# Patient Record
Sex: Female | Born: 1980 | Race: White | Hispanic: No | Smoking: Former smoker
Health system: Southern US, Community
[De-identification: ages and names within clinical notes are randomized; demographics above are authoritative.]

## PROBLEM LIST (undated history)

## (undated) DIAGNOSIS — K589 Irritable bowel syndrome without diarrhea: Secondary | ICD-10-CM

## (undated) DIAGNOSIS — G43909 Migraine, unspecified, not intractable, without status migrainosus: Secondary | ICD-10-CM

## (undated) DIAGNOSIS — B009 Herpesviral infection, unspecified: Secondary | ICD-10-CM

## (undated) DIAGNOSIS — F419 Anxiety disorder, unspecified: Secondary | ICD-10-CM

## (undated) HISTORY — DX: Irritable bowel syndrome, unspecified: K58.9

## (undated) HISTORY — DX: Anxiety disorder, unspecified: F41.9

## (undated) HISTORY — PX: WISDOM TOOTH EXTRACTION: SHX21

---

## 2000-05-07 ENCOUNTER — Other Ambulatory Visit: Admission: RE | Admit: 2000-05-07 | Discharge: 2000-05-07 | Payer: Self-pay | Admitting: Obstetrics and Gynecology

## 2002-11-01 ENCOUNTER — Emergency Department (HOSPITAL_COMMUNITY): Admission: EM | Admit: 2002-11-01 | Discharge: 2002-11-01 | Payer: Self-pay | Admitting: Emergency Medicine

## 2002-11-02 ENCOUNTER — Emergency Department (HOSPITAL_COMMUNITY): Admission: EM | Admit: 2002-11-02 | Discharge: 2002-11-02 | Payer: Self-pay | Admitting: *Deleted

## 2004-03-03 ENCOUNTER — Emergency Department (HOSPITAL_COMMUNITY): Admission: EM | Admit: 2004-03-03 | Discharge: 2004-03-04 | Payer: Self-pay | Admitting: Emergency Medicine

## 2004-03-10 ENCOUNTER — Observation Stay (HOSPITAL_COMMUNITY): Admission: EM | Admit: 2004-03-10 | Discharge: 2004-03-11 | Payer: Self-pay | Admitting: Emergency Medicine

## 2004-03-14 ENCOUNTER — Inpatient Hospital Stay (HOSPITAL_COMMUNITY): Admission: RE | Admit: 2004-03-14 | Discharge: 2004-03-16 | Payer: Self-pay | Admitting: Obstetrics & Gynecology

## 2004-04-05 ENCOUNTER — Inpatient Hospital Stay (HOSPITAL_COMMUNITY): Admission: AD | Admit: 2004-04-05 | Discharge: 2004-04-08 | Payer: Self-pay | Admitting: Obstetrics & Gynecology

## 2004-09-16 ENCOUNTER — Emergency Department (HOSPITAL_COMMUNITY): Admission: EM | Admit: 2004-09-16 | Discharge: 2004-09-16 | Payer: Self-pay | Admitting: Emergency Medicine

## 2005-05-15 ENCOUNTER — Emergency Department (HOSPITAL_COMMUNITY): Admission: EM | Admit: 2005-05-15 | Discharge: 2005-05-15 | Payer: Self-pay | Admitting: Emergency Medicine

## 2006-02-10 ENCOUNTER — Emergency Department (HOSPITAL_COMMUNITY): Admission: EM | Admit: 2006-02-10 | Discharge: 2006-02-10 | Payer: Self-pay | Admitting: Emergency Medicine

## 2007-03-15 ENCOUNTER — Emergency Department (HOSPITAL_COMMUNITY): Admission: EM | Admit: 2007-03-15 | Discharge: 2007-03-15 | Payer: Self-pay | Admitting: Emergency Medicine

## 2010-06-10 NOTE — Consult Note (Signed)
[name], Rachel Carrillo NO.:  [id number]   MEDICAL RECORD NO.:  [id number]                   PATIENT TYPE:  EMS   LOCATION:  ED                                   FACILITY:  [facility]   PHYSICIAN:  Langley Gauss, M.D.                DATE OF BIRTH:  December 30, 1980   DATE OF CONSULTATION:  11/01/2002  DATE OF DISCHARGE:                                   CONSULTATION   REQUESTING PHYSICIAN:  Hilario Quarry, M.D.   CHIEF COMPLAINT:  The patient presented to the emergency room with the chief  complaint of pregnancy, x4 weeks yesterday with the onset of cramping and  vaginal bleeding about 1330 on the day of admission.  This is a 30 year old  gravida 3, para 1 with 1 prior vaginal delivery who was seen 1 day  previously on October 31, 2002 at [facility].  The patient reports  her last menstrual period as being September 06, 2002.  She states that she  subsequently did a home pregnancy test which was negative the first part of  September.  However, a home pregnancy test was subsequently positive on  October 1st.  The patient denies any breast tenderness, nausea or vomiting.  She had been completely asymptomatic when she was seen at [facility]  on October 31, 2002.  At that time she was only seen by Victorino Dike who  performed a transvaginal ultrasound which, patient report revealed an  intrauterine sac.  The patient was advised, at that time to follow up in 2  weeks time at which time a repeat transvaginal ultrasound would be  performed.  The patient was advised that it was too early in the pregnancy  to further delineate any fetal structures.  Subsequent quantitative beta hCG  had also been performed on October 31, 2002 which Dr. Rosalia Hammers reported to me  revealed a result of 14,000.  However, both the patient and her sister  refute this stating that the result was 1400.   The patient states that last p.m. she awake with some pelvic cramping with  radiation around  to her back described as moderate discomfort. Then about  1330 today she had the onset of some vaginal bleeding with the passage of  some clots into the commode.  At that point in time the patient presented to  [facility] at which time she was evaluated by Dr. Rosalia Hammers.   PAST MEDICAL HISTORY:  She has 1 prior vaginal delivery, now, a 11-year-old.  She has no other medical or surgical history.   CURRENT MEDICATIONS:  None.   ALLERGIES:  She has no known drug allergies.   SOCIAL HISTORY:  The patient is noted to be a cigarette smoker.  Her sister  is employed at [employer] and had obtained the  quantitative results from October 31, 2002 for 1400.   PAST MEDICAL HISTORY:  Pertinently in the patient's past medical history she  has no risk factors for ectopic pregnancy, specifically no prior history of  PID. No prior major surgical procedure. No known history of tubal occlusion  or history of infertility.  She denies any significant dysmenorrhea or any  menorrhagia with menses.   PHYSICAL EXAMINATION:  GENERAL:  The patient is noted to be in no acute  distress.  VITAL SIGNS:  Temperature 97.5, blood pressure 126/78, heart rate of 94,  respiratory rate is 20.  HEENT:  Negative.  No adenopathy.  Mucous membranes are moist.  NECK:  Supple.  Thyroid is nonpalpable.  LUNGS:  Clear.  CARDIOVASCULAR:  Exam is regular rate and rhythm.  ABDOMEN:  The abdomen is soft and nontender. No pelvic or abdominal masses  are appreciated.  No surgical scars are identified.  There is mild  tenderness in the right lower quadrant but no peritoneal signs.  Specifically no rebound or guarding identified.  EXTREMITIES:  Noted to be normal.  PELVIC:  Normal external genitalia.  No lesions or ulcerations identified.  No external evidence of any vaginal bleeding. The cervix is visualized.  The  os is noted to be closed close to a small Q-tip.  Several small clots within  the posterior  vaginal fornix which are evacuated.  There is no active  bleeding occurring at this time.  No abnormal discharge is noted.  GC and  Chlamydia cultures are performed from the endocervix.  Bimanual examination  reveals a minimal increased sized uterus. No adnexal masses are appreciated.  Minimal tenderness in the right side of the lower uterine segment.  No  suggestion of any free fluid within the pelvis.   LABORATORY STUDIES:  Reveal a white count of 8.5, hemoglobin 13.3,  hematocrit 39.2.  A quantitative beta hCG done today gives a result of 1700.  This represents an increase from 1400 to 1700 over a slightly greater than a  24-hour period.  Note, these specimens were not drawn concomitantly.  The  patient is noted to be O positive blood type with a negative antibody  screen.   A transabdominal ultrasound is performed by Dr. Roylene Reason. Lisette Grinder, of note,  today is Saturday and the hospital does not provide this service nor is  there availability of transvaginal ultrasound to be performed; hospital  administration is aware of this.  Thus, the best effort that I am able to  provide with this patient is a transabdominal ultrasound using the  ultrasound machine from labor and delivery.  A transabdominal ultrasound is  performed and interpreted by Dr. Roylene Reason. Lisette Grinder which reveals a very  small, poorly visualized intrauterine sac, minimal free fluid within the  pelvis.  Right and left ovaries poorly visualized, but noted to be normal in  appearance.  The patient tolerated the ultrasound very well.   ASSESSMENT:  Patient with an ultrasound 1 day previously revealing a 4-mm  intrauterine sac and quantitative beta hCG of 1400; impression by her  prenatal care providers was that of intrauterine pregnancy with plans to  repeat the sonogram in 2 weeks time.  The patient now presents with some  vaginal bleeding and passage of clots and an increased quantitative beta hCG to 1700.  Concern at this  time includes that of abnormal intrauterine  pregnancy, threatened abortion, but ectopic pregnancy continues to be a  consideration in the differential.  The patient is advised that we will  repeat the quantitative beta hCG on November 02, 2002 which will be a 48-hour  time interval.  She is made aware that should heavy vaginal bleeding occur,  or should the pelvic pain markedly increase in its intensity that she should  present to [facility] for reevaluation.      ___________________________________________                                            Langley Gauss, M.D.   DC/MEDQ  D:  11/01/2002  T:  11/02/2002  Job:  [id number]   cc:   [facility]   [facility]

## 2010-06-10 NOTE — H&P (Signed)
Rachel Carrillo, Rachel Carrillo            ACCOUNT NO.:  [id number]   MEDICAL RECORD NO.:  [id number]          PATIENT TYPE:  OIB   LOCATION:  LDR2                          FACILITY:  [facility]   PHYSICIAN:  Tilda Burrow, M.D. DATE OF BIRTH:  [birth year]   DATE OF ADMISSION:  03/13/2004  DATE OF DISCHARGE:  LH                                HISTORY & PHYSICAL   REASON FOR ADMISSION:  Pregnancy at 36 weeks and 3 days with hypokalemia,  nausea and vomiting ____irregular contractions_ and urinary tract infection.   HISTORY OF PRESENT ILLNESS:  Rachel Carrillo presented yesterday with nausea,  vomiting, dehydration, and _irregular_ contractions. She was admitted to  observation admission yesterday by Dr. Lisette Grinder and treated symptomatically  for her hypokalemia and for her nausea and vomiting _irregular___  contractions.   PHYSICAL EXAMINATION:  VITAL SIGNS:  Stable. Potassium was 3.1. Drug screen  was positive for THC. Lipase was 12. Urine nitrites were positive, many WBCs  on a catheter urine. Potassium was 3.2. SGOT was 115, SGPT was 96. Rubella  is immune. Hepatitis B is negative. HIV is negative. RPR is nonreactive.  Blood type is O positive. Ultrasound was obtained on February 17 for  abdominal pain and showed some thickening of the gallbladder and some sludge  in the gallbladder but no positive Murphy's sign at that time. Her first  prenatal visit was scheduled for today in the office, but due to her  hospitalization, that is not possible. Fundal height is 37-38 cm. Vital  signs were stable. The patient is able now to tolerate p.o.   PLAN:  We are going to repeat her Chem 7 to evaluate her hypokalemia and  monitor her I&O and continue her IV antibiotics for her UTI and probable  discharge in the a.m.      DL/MEDQ  D:  32/95/1884  T:  03/14/2004  Job:  [id number]

## 2010-06-10 NOTE — Op Note (Signed)
NAMEKELLYE, MIZNER            ACCOUNT NO.:  [id number]   MEDICAL RECORD NO.:  [id number]          PATIENT TYPE:  INP   LOCATION:  LDR1                          FACILITY:  [facility]   PHYSICIAN:  Tilda Burrow, M.D. DATE OF BIRTH:  28-Oct-1980   DATE OF PROCEDURE:  04/06/2004  DATE OF DISCHARGE:                                  PROCEDURE NOTE   DELIVERY SUMMARY:   ONSET OF LABOR:  April 06, 2004 at 8 a.m.   DATE OF DELIVERY:  April 06, 2004 at 1630 hours.   LENGTH OF FIRST STAGE LABOR:  8 hours, 15 minutes.   LENGTH OF SECOND STAGE LABOR:  15 minutes.   LENGTH OF THIRD STAGE LABOR:  5 minutes.   DELIVERY NOTE:  Leianne had a spontaneous vaginal delivery of a viable female  infant without any complications.  Upon delivery of infant, he was  thoroughly suctioned, dried, cord clamped and cut, and placed on the  mother's abdomen for nursery care.  The infant had good tone, pinked up  well, movement of all extremities, and good respirations_.  The third stage  of labor was actively managed with 20 units of Pitocin and 1000 cc of D-5 LR  at a rapid rate.  The perineum was noted to be intact upon inspection.  Cord  blood gas was obtained.  The placenta was delivered spontaneously via  Tomasa Blase mechanism.  Membranes were noted to be intact upon inspect.  Estimated blood loss was approximately 300 cc.  Infant and mother were  stabilized, and transferred out to the postpartum unit in stable condition.      DL/MEDQ  D:  16/10/9602  T:  04/06/2004  Job:  [id number]

## 2010-06-10 NOTE — Discharge Summary (Signed)
   NAMEJARETZY, Rachel Carrillo NO.:  [id number]   MEDICAL RECORD NO.:  [id number]                   PATIENT TYPE:   LOCATION:                                       FACILITY:   PHYSICIAN:  Langley Gauss, M.D.                DATE OF BIRTH:   DATE OF ADMISSION:  DATE OF DISCHARGE:                                 DISCHARGE SUMMARY   D&C with suction performed on November 02, 2002.  The patient also discharged  to home on November 02, 2002 following the procedure.   DISPOSITION:  The patient is to contact [facility] in the a.m. of  November 03, 2002 to arrange for appropriate follow up.   DISCHARGE MEDICATIONS:  She is treated with doxycycline 100 mg p.o. b.i.d.  x7 days. In addition Vicoprofen 1 p.o. q.4-6h. p.r.n. postoperative uterine  cramping.   She is give a copy of the standardized discharge instructions.  Pertinent  laboratory studies include admission hemoglobin and hematocrit 13.1/38.8  with a white count of 6.0.   HOSPITAL COURSE:  See previous dictations.  The patient was contacted and  decision was made to proceed with dilatation and curettage utilizing  suction.  After being processed through the emergency room, the patient was  taken to the OR where the operative procedure was performed without  complications.  No family members are available to discuss operative  findings following the procedure.  Her sister, Angelica Chessman, had dropped her off  but was engaged in child care responsibilities at that time.  However, she  was present prior to performing the procedure and was fully in agreement  with what procedure was planned.  Thus, the patient is discharged to home on  November 02, 2002.  As stated previously, she is advised to follow up with  [facility].     ___________________________________________                                         Langley Gauss, M.D.   DC/MEDQ  D:  11/04/2002  T:  11/04/2002  Job:  [id number]

## 2010-06-10 NOTE — Discharge Summary (Signed)
Rachel Carrillo, Rachel Carrillo            ACCOUNT NO.:  [id number]   MEDICAL RECORD NO.:  [id number]          PATIENT TYPE:  OBV   LOCATION:  A415                          FACILITY:  [facility]   PHYSICIAN:  [name], M.D.   DATE OF BIRTH:  Jun 28, 1980   DATE OF ADMISSION:  03/10/2004  DATE OF DISCHARGE:  02/17/2006LH                                 DISCHARGE SUMMARY   OBSERVATION NOTE   Claudina presented to the emergency room with complaints of nausea and  vomiting. She states that she had just received positive pregnancy  confirmation the week before from the emergency room doctor where she was  told she was 6 to [redacted] weeks pregnant based on her quantitative HCG level.  Obviously, this is not correct due to her fundal height of 35 cm and a  palpable approximate 6 pound infant. She was treated with IV fluids and  Phenergan. Her labs included elevated LFTs. Her gallbladder ultrasound was  positive for wall thickening and sludge. Otherwise, everything was  unremarkable. She stayed overnight and felt much better in the morning and  was able to tolerate a diet. She had an ultrasound done of the baby and her  gallbladder with the results as above. She was also given a hepatitis workup  which was negative due to the slight elevation of her LFTs and was  discharged home in stable condition.      FC/MEDQ  D:  03/15/2004  T:  03/15/2004  Job:  [id number]   cc:   [facility]

## 2010-06-10 NOTE — H&P (Signed)
Rachel Carrillo, Rachel Carrillo NO.:  [id number]   MEDICAL RECORD NO.:  [id number]                   PATIENT TYPE:  EMS   LOCATION:  ED                                   FACILITY:  [facility]   PHYSICIAN:  Langley Gauss, M.D.                DATE OF BIRTH:  1980/09/21   DATE OF ADMISSION:  11/02/2002  DATE OF DISCHARGE:                                HISTORY & PHYSICAL   HISTORY OF PRESENT ILLNESS:  This is a 30 year old, G3, P1 with a somewhat  unreliable last menstrual period who was seen in the emergency room the day  previously on November 01, 2002, at which time she was given the diagnosis of  threatened AB with vaginal bleeding and intrauterine sac on ultrasound per  [facility] on October 31, 2002.  The patient contacted me the a.m. of  November 02, 2002, stating that she continued to have vaginal bleeding, now  passing clots with cramping which was uncomfortable enough that kept her  awake most of the evening.  In addition, she has had serial quantitative  beta HCGs performed which have now begun to fall.  When I contacted the  patient by phone and discussed all these findings, we opted to proceed with  suction curettage in the operating room.  The patient was thus advised to  present to the emergency room on November 02, 2002.   PAST MEDICAL HISTORY:  One prior vaginal delivery.   ALLERGIES:  No known drug allergies.   CURRENT MEDICATIONS:  None.   PHYSICAL EXAMINATION:  GENERAL:  No acute distress.  VITAL SIGNS:  Blood pressure 105/63, pulse 80, respirations 20, temperature  98.1.  HEENT:  Negative.  No adenopathy.  NECK:  Supple.  Thyroid nonpalpable.  LUNGS:  Clear.  CARDIAC:  Regular rate and rhythm.  ABDOMEN:  Soft and nontender.  No surgical scars are identified.  EXTREMITIES:  Within normal limits.  PELVIC:  Normal external genitalia.  No lesions or ulcers identified.  There  is some blood on the inner thighs consistent with active  bleeding.  Sterile  speculum exam is performed which reveals about 50 cc of clots within the  vaginal vault which are evacuated.  Cervix is dilated enough to permit  passage of small Q-tip.  Bimanual examination reveals anteflexed uterus,  minimally enlarged with no adnexal masses.  Adnexa palpably normal.   LABORATORY DATA AND X-RAY FINDINGS:  The patient is noted to be O positive  blood type.  Serial quantitative beta HCGs performed.  Initially, the  patient was seen in [facility] on October 31, 2002, with a  quantitative beta HCG obtained of 147.  Upon presentation to the emergency  room on November 01, 2002, quantitative beta HCG had risen to 1700.  It is now  reassessed on November 02, 2002, 48 hours after the first and has now begun  to fall down  to 1640.   ASSESSMENT:  Spontaneous incomplete abortion with vaginal bleeding.  The  ultrasound had been performed in the office of [facility] on October 31, 2002, with the impression of the ultrasonographer being that of  intrauterine sac 4+ weeks' gestation too early  to further evaluate any  structures within the gestational sac.  The gestational sac size was  measured at 4 mm.   PLAN:  With the ongoing cramping and bleeding at this time, rather than  managing this expectantly, the patient prefers to be taken to the operating  room at which time suction dilatation and curettage can be performed.  Risks  and benefits of the procedure have been discussed extensively with the  patient and her sister, Angelica Chessman, in the emergency room on November 01, 2002.  The operating room is to be made readily available for the procedure.  The  patient was noted to have some intake by mouth at 11:30 a.m., thus  pericervical plus MAC will be the anesthesia of choice.     ___________________________________________                                         Langley Gauss, M.D.   DC/MEDQ  D:  11/04/2002  T:  11/04/2002  Job:  [id number]   cc:    [facility]

## 2010-06-10 NOTE — H&P (Signed)
NAMETRANISHA, TISSUE            ACCOUNT NO.:  [id number]   MEDICAL RECORD NO.:  [id number]          PATIENT TYPE:  OIB   LOCATION:  LDR2                          FACILITY:  [facility]   PHYSICIAN:  Langley Gauss, MD     DATE OF BIRTH:  Jun 03, 1980   DATE OF ADMISSION:  03/13/2004  DATE OF DISCHARGE:  LH                                HISTORY & PHYSICAL   The patient is a 30 year old patient with very minimal, inadequate, and late  prenatal care who comes in today with the chief complaint of nausea and  vomiting.  The patient's history began as follows.  February 29, 2004, the  patient began having the initial symptoms of nausea and vomiting with  resultant dehydration.  The patient presented to [facility]  on March 03, 2004, with these complaints.  The patient was a registered  patient of [facility], having been cared for previously.  On this  date of service, March 03, 2004, she was seen by the emergency room  physician only.  A surprise to the patient was the diagnosis of pregnancy.  She was advised by the emergency room staff that she was six to eight weeks  pregnant.  The patient was treated with IV fluids for less than 24 hours  duration; however, she states that this resulted in marked improvement in  her overall feeling. She states subsequently after being sent home from the  emergency room March 03, 2004, she very shortly thereafter had resumption  of the nausea and vomiting.  The patient is totally uncertain of whether or  not she has had any significant weight loss or weight gain during this  pregnancy, and currently she can be considered to be completely unreliable  as demonstrated by the emergency room visit March 03, 2004, at which time  she was completely oblivious of the fact that a pregnancy existed.  Pertinently in review of emergency room records at that time are  unavailable.   The patient subsequently stated that she returned to the  emergency room on  March 10, 2004, with similar complaints.  At that time, she was seen by  Dr. Margretta Ditty and was noted to have electrolyte abnormalities with sodium  129, potassium 3.2.  The patient subsequently had care undertaken by [facility].  She received IV fluids and subsequently radiologic studies  performed March 11, 2004.  The March 11, 2004, OB ultrasound revealed  intrauterine pregnancy at 35-5/[redacted] weeks gestation, female fetus.  Ultrasound of  the gallbladder resulted in the finding of sludge in the gallbladder,  slightly thickened gallbladder wall measuring 3.5 mm.  No discrete stones.  The patient had a negative Murphy's sign.  Mild right hydronephrosis likely  consistent with pregnancy was also seen.  The patient was discharged from  the hospital dated March 11, 2004, by [facility] with a followup  appointment scheduled for March 14, 2004.   The patient states she has continued to have episodic nausea and vomiting  since that point in time, now with vomiting of some greenish type substance.  Pertinently during  that most recent hospitalization and evaluation,  laboratory studies dated March 10, 2004, revealed abnormal liver function  tests with an SGOT of 115 (normal 0 to 37), SGPT 96 (normal 0 to 40), and  abnormal electrolytes identified.  The patient was discharged home with some  unknown medications.   OB HISTORY:  The patient does have one prior vaginal delivery.   PHYSICAL EXAMINATION:  GENERAL:  She appears in no acute distress at present  but was previously noted to be having heaving within the room.  VITAL SIGNS:  Blood pressure 108/66, pulse rate 80, respiratory rate 123.  ABDOMEN:  Soft and nontender.  Gravid uterus is identified.  No mid  epigastric or right upper quadrant tenderness elicited.  Fundal height is 34  cm.  Fetal heart tones are auscultated in the 150s.  EXTREMITIES:  Normal with no significant edema.   LABORATORY  DATA AND OTHER STUDIES:  Due to the indication of severe  hyperemesis gravidarum with hyponatremia, hypokalemia, and ketonuria, a non-  stress test is requested and interpreted.  Nonstress test fetal heart rate  baseline of 150.  There are accelerations noted of greater than 15 beats per  minute x greater than 15 seconds duration.  No fetal heart rate  decelerations are noted.  No uterine activity is demonstrated on the  external toco.   ASSESSMENT AND PLAN:  A patient with abnormal gallbladder ultrasound with  sludge identified.  In addition, she has elevated liver function tests per  the report of Eber Jones, R.N.  Hepatitis profile had been requested and  performed prior to discharge from the hospital on March 11, 2004.  The  patient will be continued on IV fluid hydration therapy, started on bland  low-fat diet, and treated empirically with IV antibiotics.  Subsequent care  will be transferred to [facility] on March 14, 2004.      DC/MEDQ  D:  03/13/2004  T:  03/13/2004  Job:  [id number]

## 2010-06-10 NOTE — H&P (Signed)
NAMEBERNADENE, GARSIDE            ACCOUNT NO.:  [id number]   MEDICAL RECORD NO.:  [id number]          PATIENT TYPE:  INP   LOCATION:  A416                          FACILITY:  [facility]   PHYSICIAN:  [name], M.D.   DATE OF BIRTH:  1980/05/16   DATE OF ADMISSION:  04/05/2004  DATE OF DISCHARGE:  03/17/2006LH                                HISTORY & PHYSICAL   HISTORY OF PRESENT ILLNESS:  Labor induction secondary to being term.  Lannie did not begin prenatal care until she was [redacted] weeks pregnant.  She  claims she did not know that she was pregnant.   Prenatal labs include blood type O+.  Rubella immune.  HB, SAG, HIV, RPR,  gonorrhea/Chlamydia, and group B strep were all negative.  A one-hour GGT  was normal at 103.  She was seropositive for HSV II.  She did not have time to begin her HSV prophylaxis.  We just found out about  it yesterday.  She did have a thorough vaginal exam, and no lesions were  noted.  No subjective symptoms by the patient to indicate active or oncoming  herpes reaction.   Blood pressure 110-120s/80s.  Fundal height 37 cm.  Estimated fetal weight  about 7 pounds.   PAST MEDICAL HISTORY:  Noncontributory.   PAST SURGICAL HISTORY:  Noncontributory.   No known drug allergies.   OB HISTORY:  Elective AB in 1998.  A vaginal delivery of a 10 pound female  in 1999.  Two first trimester miscarriages in 2005 and 2004.   IMPRESSION:  Intrauterine pregnancy at about 40 weeks.  Induction of labor.   PLAN:  Foley bulb induction and Pitocin and AROM in the morning.      FC/MEDQ  D:  04/08/2004  T:  04/08/2004  Job:  [id number]

## 2010-06-10 NOTE — Op Note (Signed)
NAMEGEANNA, DIVIRGILIO NO.:  [id number]   MEDICAL RECORD NO.:  [id number]                   PATIENT TYPE:  EMS   LOCATION:  ED                                   FACILITY:  [facility]   PHYSICIAN:  Langley Gauss, M.D.                DATE OF BIRTH:  [birth year]   DATE OF PROCEDURE:  DATE OF DISCHARGE:  11/02/2002                                 OPERATIVE REPORT   PREOPERATIVE DIAGNOSIS:  Spontaneous incomplete abortion, first trimester,  likely 5-[redacted] weeks gestation.   POSTOPERATIVE DIAGNOSIS:  Spontaneous incomplete abortion, first trimester,  likely 5-[redacted] weeks gestation.   PROCEDURE:  Dilatation and curettage utilizing # 9 suction catheter tip  performed by Dr. Roylene Reason. Lisette Grinder.   ESTIMATED BLOOD LOSS:  Minimal.   ANALGESIA:  The patient received MAC plus pericervical block placed by Dr.  Roylene Reason. Lisette Grinder.   SPECIMENS:  What appears to be a collapsed gestational sac is collected in  the suction canister and is sent to pathology for permanent section only.   OPERATIVE FINDINGS:  The patient was taken to the operating room and vital  signs were stable.  The patient was placed in the low lithotomy position  after receiving appropriate IV sedation. She is noted to have evidence of  active bleeding with blood on the inner thighs.   DESCRIPTION OF PROCEDURE:  The patient is then sterilely prepped and draped.  A sterile speculum examination is performed.  The cervix is noted to be  slightly opened.  The paracervical block is then placed by utilizing a total  of 8 cc of 1% lidocaine plain placing it at 2, 4, 8, and 10 o'clock  utilizing a spinal needle.  This allows the anterior lip of the cervix to be  grasped with a single-tooth tenaculum.  The uterus is gently sounded in  order to sound to a depth of 8 cm.  A very gentle, progressive dilatation is  then performed up to a size #23 dilator which allows passage of a #9 suction  catheter tip through the  cervix and up to the uterine fundus.  Suction is  then applied and with a slow circular withdrawing motion the entire uterine  cavity is suctioned curettaged of what appears to be products of conception.   A fine curettage is then performed of the intrauterine cavity.  The  intrauterine cavity is noted to be smooth and normal in contour.  No  additional products are obtained; thus the procedure is terminated.  Bimanual examination reveals a slightly enlarged uterus, but no adnexal  mass.  The specimen is sent for permanent section.  There is noted to be  minimal active bleeding.  The patient is, thus, taken to recovery room in  stable condition.      ___________________________________________  Langley Gauss, M.D.   DC/MEDQ  D:  11/04/2002  T:  11/04/2002  Job:  [id number]

## 2010-06-27 LAB — ANTIBODY SCREEN: Antibody Screen: NEGATIVE

## 2010-06-27 LAB — HEPATITIS B SURFACE ANTIGEN: Hepatitis B Surface Ag: NEGATIVE

## 2010-06-27 LAB — RUBELLA ANTIBODY, IGM: Rubella: IMMUNE

## 2010-06-27 LAB — HIV ANTIBODY (ROUTINE TESTING W REFLEX): HIV: NONREACTIVE

## 2010-07-25 ENCOUNTER — Other Ambulatory Visit (HOSPITAL_COMMUNITY)
Admission: RE | Admit: 2010-07-25 | Discharge: 2010-07-25 | Disposition: A | Discharge status: Home / Self Care | Payer: Medicaid Other | Source: Ambulatory Visit | Attending: Obstetrics & Gynecology | Admitting: Obstetrics & Gynecology

## 2010-07-25 DIAGNOSIS — Z01419 Encounter for gynecological examination (general) (routine) without abnormal findings: Secondary | ICD-10-CM | POA: Insufficient documentation

## 2010-07-25 DIAGNOSIS — Z113 Encounter for screening for infections with a predominantly sexual mode of transmission: Secondary | ICD-10-CM | POA: Insufficient documentation

## 2010-10-14 LAB — URINALYSIS, ROUTINE W REFLEX MICROSCOPIC
Nitrite: POSITIVE — AB
Protein, ur: 30 — AB
Specific Gravity, Urine: >1.03 — ABNORMAL HIGH

## 2010-10-14 LAB — URINE MICROSCOPIC-ADD ON

## 2010-10-14 LAB — URINE CULTURE: Colony Count: >=100000

## 2011-01-24 NOTE — L&D Delivery Note (Addendum)
Delivery Note At 11:09 PM a viable and healthy female was delivered via Vaginal, Spontaneous Delivery (Presentation: ; Occiput Anterior).  APGAR: 8, 8; weight 9 lb 13.3 oz (4459 g).   Placenta status: Intact, Spontaneous.  Cord: 3 vessels with the following complications: None.  Cord pH: not indicated   Anesthesia: Epidural  Episiotomy: None Lacerations: 1st degree Suture Repair: 3.0 vicryl Est. Blood Loss (mL): 350  Mom to postpartum.  Baby to nursery-stable  Dr Candelaria Celeste, DO, precepting.  Cameron Proud 02/17/2011, 11:37 PM

## 2011-02-14 ENCOUNTER — Encounter (HOSPITAL_COMMUNITY): Payer: Self-pay | Admitting: *Deleted

## 2011-02-14 ENCOUNTER — Observation Stay (HOSPITAL_COMMUNITY)
Admission: AD | Admit: 2011-02-14 | Discharge: 2011-02-15 | DRG: 780 | Disposition: A | Discharge status: Home / Self Care | Payer: Medicaid Other | Source: Ambulatory Visit | Attending: Obstetrics and Gynecology | Admitting: Obstetrics and Gynecology

## 2011-02-14 DIAGNOSIS — IMO0001 Reserved for inherently not codable concepts without codable children: Secondary | ICD-10-CM

## 2011-02-14 DIAGNOSIS — O479 False labor, unspecified: Principal | ICD-10-CM | POA: Diagnosis present

## 2011-02-14 DIAGNOSIS — O368390 Maternal care for abnormalities of the fetal heart rate or rhythm, unspecified trimester, not applicable or unspecified: Secondary | ICD-10-CM | POA: Diagnosis present

## 2011-02-14 LAB — URINE MICROSCOPIC-ADD ON

## 2011-02-14 LAB — URINALYSIS, ROUTINE W REFLEX MICROSCOPIC
Bilirubin Urine: NEGATIVE
Protein, ur: NEGATIVE mg/dL
Urobilinogen, UA: 0.2 mg/dL (ref 0.0–1.0)
pH: 6 (ref 5.0–8.0)

## 2011-02-14 MED ORDER — LACTATED RINGERS IV BOLUS (SEPSIS)
1000.0000 mL | Freq: Once | INTRAVENOUS | Status: AC
  Administered 2011-02-14: 1000 mL via INTRAVENOUS

## 2011-02-14 NOTE — Progress Notes (Signed)
Dr. Aviva Signs at bedside.  Strip reviewed and poc discussed with pt.

## 2011-02-14 NOTE — Progress Notes (Signed)
Pt returned from walking at this time.  efm and toco replaced.  Assessing.

## 2011-02-14 NOTE — Progress Notes (Signed)
D. Poe, CNM at bedside.  Assessment done and poc discussed with pt.  

## 2011-02-14 NOTE — Progress Notes (Signed)
Pt had membranes stripped today.

## 2011-02-14 NOTE — Progress Notes (Signed)
Notified of BP's.  Notified of VE with no change but more anterior and softer.  Orders to ambulate for an hour and then recheck.

## 2011-02-14 NOTE — Progress Notes (Signed)
Pt states, " They stripped my membranes at 10:00 am, and I was 4 cm. I had some bleeding afterwards. I see it if I wipe. I am now feeling constant pain in my low abdomen, and pressure in my vagina."

## 2011-02-14 NOTE — Progress Notes (Signed)
Pincus Badder, CNM reviewed strip.  Notified of tachycardia with indeterminate baseline and unsure of decels.  States baseline is 150 with prolonged accels.  Will dc home.

## 2011-02-14 NOTE — Progress Notes (Signed)
Pt up to bathroom for urine specimen collection.

## 2011-02-14 NOTE — Progress Notes (Signed)
Pt states she has been hurting since this morning before her appointment.  Constant pressure in lower abdomen and vagina.  Not sure if ctxs.

## 2011-02-14 NOTE — Progress Notes (Signed)
Notified of no cervical change but fetal tachycardia after walking.  Orders received for IV fluids.

## 2011-02-14 NOTE — ED Provider Notes (Signed)
Sande Rives [id number] @[redacted]w[redacted]d  Chief Complaint  Patient presents with  . Labor Eval    SUBJECTIVE  HPI: Crampy abd pain and bloody mucusy discharge since membranes stripped in office at [facility] this am. States cx was 4 cm today. Denies painful UCs but does not know what contractions feel like (though SVD x 2). No leakage of fluid or frank bleeding. Good fetal movement. No H/A, visual disturbance or epigastric pain. States BP 130/90 today.  Preg complicated by hx preE, hx LGA, UTI, smoker.  Past Medical History  Diagnosis Date  . No pertinent past medical history    Past Surgical History  Procedure Date  . Wisdom tooth extraction    History   Social History  . Marital Status: Married    Spouse Name: N/A    Number of Children: N/A  . Years of Education: N/A   Occupational History  . Not on file.   Social History Main Topics  . Smoking status: Current Everyday Smoker -- 0.2 packs/day  . Smokeless tobacco: Not on file  . Alcohol Use: No  . Drug Use: No  . Sexually Active:    Other Topics Concern  . Not on file   Social History Narrative  . No narrative on file   No current facility-administered medications on file prior to encounter.   No current outpatient prescriptions on file prior to encounter.   Not on File  ROS: Pertinent items in HPI  OBJECTIVE  BP 125/89  Pulse 105  Temp(Src) 98.5 F (36.9 C) (Oral)  Resp 20  Ht 5\' 4"  (1.626 m)  Wt 84.482 kg (186 lb 4 oz)  BMI 31.97 kg/m2  SpO2 98%  Physical Exam  Constitutional: She is oriented to person, place, and time and well-developed, well-nourished, and in no distress. No distress.  HENT:  Head: Normocephalic.  Neck: Neck supple.  Cardiovascular: Normal rate.   Pulmonary/Chest: Effort normal.  Abdominal: Soft. There is no tenderness.  Genitourinary:       VE by Jae Dire, RN: posterior, soft 3-4/70/-3 vtx, bloody show  Musculoskeletal: Normal range of motion.  Neurological: She is alert and  oriented to person, place, and time.  Skin: Skin is warm and dry.  Psychiatric: Affect normal.   Toco: Occasional UC and UI FHR 140 baseline, reactive, moderate variability  ASSESSMENT False labor Cat 1 FHR Borderline BP    PLAN Continue to monitor fetus and check serial BPs x 1 hr. Care turned over to Philipp Deputy, CNM at [phone].

## 2011-02-15 LAB — CBC
Hemoglobin: 10.8 g/dL — ABNORMAL LOW (ref 12.0–15.0)
MCH: 25.5 pg — ABNORMAL LOW (ref 26.0–34.0)
MCV: 80.4 fL (ref 78.0–100.0)
Platelets: 228 10*3/uL (ref 150–400)
RBC: 4.23 MIL/uL (ref 3.87–5.11)
WBC: 14 10*3/uL — ABNORMAL HIGH (ref 4.0–10.5)

## 2011-02-15 LAB — STREP B DNA PROBE: GBS: NEGATIVE

## 2011-02-15 LAB — GC/CHLAMYDIA PROBE AMP, GENITAL

## 2011-02-15 LAB — RPR: RPR Ser Ql: NONREACTIVE

## 2011-02-15 MED ORDER — FLEET ENEMA 7-19 GM/118ML RE ENEM: 1.0000 | ENEMA | RECTAL | Status: DC | PRN

## 2011-02-15 MED ORDER — LACTATED RINGERS IV SOLN: INTRAVENOUS | Status: DC

## 2011-02-15 MED ORDER — ONDANSETRON HCL 4 MG/2ML IJ SOLN: 4.0000 mg | Freq: Four times a day (QID) | INTRAMUSCULAR | Status: DC | PRN

## 2011-02-15 MED ORDER — LIDOCAINE HCL (PF) 1 % IJ SOLN: 30.0000 mL | INTRAMUSCULAR | Status: DC | PRN

## 2011-02-15 MED ORDER — CITRIC ACID-SODIUM CITRATE 334-500 MG/5ML PO SOLN: 30.0000 mL | ORAL | Status: DC | PRN

## 2011-02-15 MED ORDER — ACYCLOVIR 200 MG PO CAPS
400.0000 mg | ORAL_CAPSULE | Freq: Three times a day (TID) | ORAL | Status: DC
  Filled 2011-02-15: qty 2

## 2011-02-15 MED ORDER — NALBUPHINE SYRINGE 5 MG/0.5 ML
10.0000 mg | INJECTION | Freq: Once | INTRAMUSCULAR | Status: AC
  Administered 2011-02-15: 10 mg via INTRAMUSCULAR
  Filled 2011-02-15: qty 1

## 2011-02-15 MED ORDER — OXYCODONE-ACETAMINOPHEN 5-325 MG PO TABS: 2.0000 | ORAL_TABLET | ORAL | Status: DC | PRN

## 2011-02-15 MED ORDER — ZOLPIDEM TARTRATE 5 MG PO TABS: 5.0000 mg | ORAL_TABLET | Freq: Every evening | ORAL | Status: DC | PRN

## 2011-02-15 MED ORDER — OXYTOCIN 20 UNITS IN LACTATED RINGERS INFUSION - SIMPLE: 125.0000 mL/h | Freq: Once | INTRAVENOUS | Status: DC

## 2011-02-15 MED ORDER — ACYCLOVIR 400 MG PO TABS
400.0000 mg | ORAL_TABLET | Freq: Three times a day (TID) | ORAL | Status: DC
  Filled 2011-02-15: qty 1

## 2011-02-15 MED ORDER — ACETAMINOPHEN 325 MG PO TABS: 650.0000 mg | ORAL_TABLET | ORAL | Status: DC | PRN

## 2011-02-15 MED ORDER — OXYTOCIN BOLUS FROM INFUSION
500.0000 mL | Freq: Once | INTRAVENOUS | Status: DC
  Filled 2011-02-15: qty 500

## 2011-02-15 MED ORDER — HYDROXYZINE HCL 50 MG/ML IM SOLN
50.0000 mg | Freq: Once | INTRAMUSCULAR | Status: AC
  Administered 2011-02-15: 50 mg via INTRAMUSCULAR
  Filled 2011-02-15: qty 1

## 2011-02-15 MED ORDER — NALBUPHINE SYRINGE 5 MG/0.5 ML
10.0000 mg | INJECTION | Freq: Once | INTRAMUSCULAR | Status: AC
  Administered 2011-02-15: 10 mg via INTRAVENOUS
  Filled 2011-02-15: qty 1

## 2011-02-15 MED ORDER — LACTATED RINGERS IV SOLN: 500.0000 mL | INTRAVENOUS | Status: DC | PRN

## 2011-02-15 MED ORDER — IBUPROFEN 600 MG PO TABS: 600.0000 mg | ORAL_TABLET | Freq: Four times a day (QID) | ORAL | Status: DC | PRN

## 2011-02-15 NOTE — Progress Notes (Signed)
Pt may go to room 167. 

## 2011-02-15 NOTE — Progress Notes (Signed)
SVE per Dr. Aviva Signs.

## 2011-02-15 NOTE — Discharge Summary (Signed)
Obstetric Discharge Summary Reason for Admission: onset of labor and fetal tachycadia on FHMonitoring that resolved treating p'st pain and I/V fluids. Intrapartum Procedures: none Postpartum Procedures: none Complications-Operative and Postpartum: none Hemoglobin  Date Value Range Status  02/14/2011 10.8* 12.0-15.0 (g/dL) Final     HCT  Date Value Range Status  02/14/2011 34.0* 36.0-46.0 (%) Final    Discharge Diagnoses: false labor FHM  category 1 and no cervical changes for 4 hours. Discharge Information: Date: 02/15/2011 Activity: unrestricted Diet: routine Medications: None Condition: improved Instructions: refer to practice specific booklet Discharge to: home Follow-up Information    Follow up with [facility]. (as scheduled)          Newborn Data: No delivery.False labor.   D. Piloto Sherron Flemings Paz. MD PGY-1 02/15/2011, 6:19 AM

## 2011-02-15 NOTE — Discharge Summary (Signed)
Agree with above note.  Rachel Carrillo 02/15/2011 7:50 AM   

## 2011-02-15 NOTE — H&P (Signed)
Rachel Carrillo is a 31 y.o. female presenting for painful contractions. She was 4 cm at office at [facility] and membranes were stripped . No leakage of fluid or frank bleeding. Good fetal movement. BP 137/84 and then 121/82.  No headache, visual disturbance or epigastric pain. FHT Category 1 on admission on MAU. Pt was encouraged to walk and reevaluate cervical changes. Cervix was slowly changing but FH monitoring showed FHR baseline on high 150's to 160's after a bolus of 1L of LR. Pt very uncomfortable with regular painful contractions 2-3 min. We decided to admit for active phase of labor and FHM assessment.  Preg complicated by hx preE, hx LGA, UTI, smoker, HSV positive Pt with paper signed for BTL.  Maternal Medical History:  Reason for admission: Reason for Admission:   nausea  OB History    Grav Para Term Preterm Abortions TAB SAB Ect Mult Living   6 2 2  3  3   2      Past Medical History  Diagnosis Date  . No pertinent past medical history    Past Surgical History  Procedure Date  . Wisdom tooth extraction    Family History: family history is not on file. Social History:  reports that she has been smoking.  She does not have any smokeless tobacco history on file. She reports that she does not drink alcohol or use illicit drugs.  Review of Systems  Constitutional: Negative.  Negative for fever and chills.  HENT: Negative.   Eyes: Negative for blurred vision.  Respiratory: Negative.   Cardiovascular: Negative.   Gastrointestinal: Positive for abdominal pain. Negative for nausea and vomiting.  Genitourinary: Negative for dysuria and urgency.  Musculoskeletal: Positive for back pain.  Skin: Negative.   Neurological: Negative.  Negative for headaches.    Dilation: 5 Effacement (%): 80;70 Station: -2 Exam by:: Dr. Aviva Signs Blood pressure 121/82, pulse 92, temperature 98.5 F (36.9 C), temperature source Oral, resp. rate 20, height 5\' 4"  (1.626 m), weight 84.482 kg  (186 lb 4 oz), SpO2 98.00%. Exam Physical Exam  Constitutional: She is oriented to person, place, and time. She appears distressed.       Due to pain.  HENT:  Mouth/Throat: Oropharynx is clear and moist.  Eyes: Conjunctivae are normal.  Neck: Neck supple.  Cardiovascular: Normal rate and regular rhythm.   No murmur heard. Respiratory: Effort normal and breath sounds normal. No respiratory distress.  GI: Bowel sounds are normal.       Normal abdominal physical exam of a 39w gravid pt  Musculoskeletal: She exhibits no edema.  Neurological: She is alert and oriented to person, place, and time. She has normal reflexes.    Prenatal labs: ABO, Rh:  O positive Antibody: negative Rubella:  inmune RPR:   negative HBsAg:   negative HIV:   negative HSV: positive GBS:   negative 2h GTT: 79/ 126/131  Assessment: 1. Labor: active phase 2. Fetal Wellbeing: Category 2 3. Pain Control: Iv medication. May have epidural 4. GBS: neg 6. 39.5 week IUP  Plan:  1. Admit to BS 2. Routine L&D orders 3. Analgesia/anesthesia PRN   D. Piloto [employer]. MD PGY-1  02/15/2011, 12:38 AM

## 2011-02-15 NOTE — Progress Notes (Signed)
Rachel Carrillo is a 31 y.o. [id number] at [redacted]w[redacted]d by  admitted for spontaneous labor onset and FHT with tachycardia on monitor of MAU.   Subjective: More comfortable with contractions. No fluid leakage or bleeding. Since pain is managed with Iv medication fetal tachycardia resolved.  Objective: BP 128/86  Pulse 100  Temp(Src) 98.1 F (36.7 C) (Oral)  Resp 18  Ht 5\' 4"  (1.626 m)  Wt 84.482 kg (186 lb 4 oz)  BMI 31.97 kg/m2  SpO2 98%      FHT:  FHR: 140 bpm, variability: moderate,  accelerations:  Present,  decelerations:  Absent UC:   regular, every 2-3 minutes SVE:   Dilation: 3 Effacement (%): 80 Station: -2 Exam by:: LCarpenter,RN  Labs: Lab Results  Component Value Date   WBC 14.0* 02/14/2011   HGB 10.8* 02/14/2011   HCT 34.0* 02/14/2011   MCV 80.4 02/14/2011   PLT 228 02/14/2011    Assessment / Plan: Fetal tachycardia resolved after pain control on pt. FHT reassuring category 1. Labor: no cervical changes in 4 hours. Fetal Wellbeing:  Category I Pain Control:  I/V medication I/D:  n/a Possible discharge home.   D. Piloto Sherron Flemings Paz. MD PGY-1 02/15/2011, 5:44 AM

## 2011-02-15 NOTE — ED Provider Notes (Signed)
Agree with above note.  Indie Boehne 02/15/2011 7:54 AM

## 2011-02-15 NOTE — H&P (Signed)
Agree with above note.  Rachel Carrillo 02/15/2011 7:50 AM

## 2011-02-17 ENCOUNTER — Inpatient Hospital Stay (HOSPITAL_COMMUNITY)
Admission: AD | Admit: 2011-02-17 | Discharge: 2011-02-19 | DRG: 767 | Disposition: A | Discharge status: Home / Self Care | Payer: Medicaid Other | Source: Ambulatory Visit | Attending: Family Medicine | Admitting: Family Medicine

## 2011-02-17 ENCOUNTER — Inpatient Hospital Stay (HOSPITAL_COMMUNITY): Payer: Medicaid Other | Admitting: Anesthesiology

## 2011-02-17 ENCOUNTER — Encounter (HOSPITAL_COMMUNITY): Payer: Self-pay | Admitting: *Deleted

## 2011-02-17 ENCOUNTER — Encounter (HOSPITAL_COMMUNITY): Payer: Self-pay | Admitting: Anesthesiology

## 2011-02-17 DIAGNOSIS — Z9851 Tubal ligation status: Secondary | ICD-10-CM

## 2011-02-17 DIAGNOSIS — O0940 Supervision of pregnancy with grand multiparity, unspecified trimester: Secondary | ICD-10-CM

## 2011-02-17 DIAGNOSIS — Z302 Encounter for sterilization: Secondary | ICD-10-CM

## 2011-02-17 HISTORY — DX: Herpesviral infection, unspecified: B00.9

## 2011-02-17 LAB — CBC
HCT: 32.7 % — ABNORMAL LOW (ref 36.0–46.0)
Hemoglobin: 10.4 g/dL — ABNORMAL LOW (ref 12.0–15.0)
MCH: 25.2 pg — ABNORMAL LOW (ref 26.0–34.0)
MCV: 79.4 fL (ref 78.0–100.0)
RBC: 4.12 MIL/uL (ref 3.87–5.11)

## 2011-02-17 MED ORDER — OXYTOCIN 20 UNITS IN LACTATED RINGERS INFUSION - SIMPLE
1.0000 m[IU]/min | INTRAVENOUS | Status: DC
  Administered 2011-02-17: 2 m[IU]/min via INTRAVENOUS
  Filled 2011-02-17: qty 1000

## 2011-02-17 MED ORDER — LIDOCAINE HCL (PF) 1 % IJ SOLN
30.0000 mL | INTRAMUSCULAR | Status: DC | PRN
  Administered 2011-02-17: 30 mL via SUBCUTANEOUS
  Filled 2011-02-17: qty 30

## 2011-02-17 MED ORDER — FLEET ENEMA 7-19 GM/118ML RE ENEM: 1.0000 | ENEMA | RECTAL | Status: DC | PRN

## 2011-02-17 MED ORDER — PHENYLEPHRINE 40 MCG/ML (10ML) SYRINGE FOR IV PUSH (FOR BLOOD PRESSURE SUPPORT): 80.0000 ug | PREFILLED_SYRINGE | INTRAVENOUS | Status: DC | PRN

## 2011-02-17 MED ORDER — EPHEDRINE 5 MG/ML INJ: 10.0000 mg | INTRAVENOUS | Status: DC | PRN

## 2011-02-17 MED ORDER — LACTATED RINGERS IV SOLN
INTRAVENOUS | Status: DC
  Administered 2011-02-17 (×2): via INTRAVENOUS

## 2011-02-17 MED ORDER — ACETAMINOPHEN 325 MG PO TABS: 650.0000 mg | ORAL_TABLET | ORAL | Status: DC | PRN

## 2011-02-17 MED ORDER — LACTATED RINGERS IV SOLN
500.0000 mL | Freq: Once | INTRAVENOUS | Status: AC
  Administered 2011-02-17: 500 mL via INTRAVENOUS

## 2011-02-17 MED ORDER — OXYTOCIN BOLUS FROM INFUSION
500.0000 mL | Freq: Once | INTRAVENOUS | Status: DC
  Filled 2011-02-17: qty 500

## 2011-02-17 MED ORDER — NALBUPHINE SYRINGE 5 MG/0.5 ML
5.0000 mg | INJECTION | INTRAMUSCULAR | Status: DC | PRN
  Administered 2011-02-17: 10 mg via INTRAVENOUS
  Filled 2011-02-17: qty 1

## 2011-02-17 MED ORDER — OXYCODONE-ACETAMINOPHEN 5-325 MG PO TABS: 2.0000 | ORAL_TABLET | ORAL | Status: DC | PRN

## 2011-02-17 MED ORDER — DIPHENHYDRAMINE HCL 50 MG/ML IJ SOLN: 12.5000 mg | INTRAMUSCULAR | Status: DC | PRN

## 2011-02-17 MED ORDER — ONDANSETRON HCL 4 MG/2ML IJ SOLN: 4.0000 mg | Freq: Four times a day (QID) | INTRAMUSCULAR | Status: DC | PRN

## 2011-02-17 MED ORDER — IBUPROFEN 600 MG PO TABS: 600.0000 mg | ORAL_TABLET | Freq: Four times a day (QID) | ORAL | Status: DC | PRN

## 2011-02-17 MED ORDER — LIDOCAINE HCL 1.5 % IJ SOLN
INTRAMUSCULAR | Status: DC | PRN
  Administered 2011-02-17 (×3): 4 mL via INTRADERMAL

## 2011-02-17 MED ORDER — LACTATED RINGERS IV SOLN
500.0000 mL | INTRAVENOUS | Status: DC | PRN
  Administered 2011-02-17: 500 mL via INTRAVENOUS

## 2011-02-17 MED ORDER — TERBUTALINE SULFATE 1 MG/ML IJ SOLN: 0.2500 mg | Freq: Once | INTRAMUSCULAR | Status: AC | PRN

## 2011-02-17 MED ORDER — EPHEDRINE 5 MG/ML INJ
10.0000 mg | INTRAVENOUS | Status: DC | PRN
  Administered 2011-02-17: 10 mg via INTRAVENOUS
  Filled 2011-02-17: qty 4

## 2011-02-17 MED ORDER — CITRIC ACID-SODIUM CITRATE 334-500 MG/5ML PO SOLN: 30.0000 mL | ORAL | Status: DC | PRN

## 2011-02-17 MED ORDER — PHENYLEPHRINE 40 MCG/ML (10ML) SYRINGE FOR IV PUSH (FOR BLOOD PRESSURE SUPPORT)
80.0000 ug | PREFILLED_SYRINGE | INTRAVENOUS | Status: DC | PRN
  Filled 2011-02-17: qty 5

## 2011-02-17 MED ORDER — OXYTOCIN 20 UNITS IN LACTATED RINGERS INFUSION - SIMPLE: 125.0000 mL/h | Freq: Once | INTRAVENOUS | Status: DC

## 2011-02-17 MED ORDER — FENTANYL 2.5 MCG/ML BUPIVACAINE 1/10 % EPIDURAL INFUSION (WH - ANES)
14.0000 mL/h | INTRAMUSCULAR | Status: DC
  Administered 2011-02-17: 14 mL/h via EPIDURAL
  Filled 2011-02-17 (×2): qty 60

## 2011-02-17 NOTE — Progress Notes (Signed)
Subjective: Patient comfortable with epidural.  Objective: BP 120/85  Pulse 102  Temp(Src) 98.2 F (36.8 C) (Oral)  Resp 20  Ht 5\' 4"  (1.626 m)  Wt 84.369 kg (186 lb)  BMI 31.93 kg/m2  SpO2 99%     FHT:  FHR: 145 bpm, variability: moderate,  accelerations:  Present,  decelerations:  Absent UC:   regular, every 2-3 minutes SVE:   Dilation: 5 Effacement (%): 70 Station: -1 Exam by:: Jenelle Drennon, DO  Labs: Lab Results  Component Value Date   WBC 11.0* 02/17/2011   HGB 10.4* 02/17/2011   HCT 32.7* 02/17/2011   MCV 79.4 02/17/2011   PLT 167 02/17/2011    Assessment / Plan: Spontaneous labor, progressing normally  Labor: AROM with clear fluid.  IUPC placed Fetal Wellbeing:  Category I Pain Control:  Epidural I/D:  n/a Anticipated MOD:  NSVD  Rachel Carrillo 02/17/2011, 7:47 PM

## 2011-02-17 NOTE — Anesthesia Procedure Notes (Signed)
Epidural Patient location during procedure: OB Start time: 02/17/2011 5:28 PM Reason for block: procedure for pain  Staffing Performed by: anesthesiologist   Preanesthetic Checklist Completed: patient identified, site marked, surgical consent, pre-op evaluation, timeout performed, IV checked, risks and benefits discussed and monitors and equipment checked  Epidural Patient position: sitting Prep: site prepped and draped and DuraPrep Patient monitoring: continuous pulse ox and blood pressure Approach: midline Injection technique: LOR air  Needle:  Needle type: Tuohy  Needle gauge: 17 G Needle length: 9 cm Needle insertion depth: 5 cm cm Catheter type: closed end flexible Catheter size: 19 Gauge Catheter at skin depth: 10 cm Test dose: negative  Assessment Events: blood not aspirated, injection not painful, no injection resistance, negative IV test and no paresthesia  Additional Notes Discussed risk of headache, infection, bleeding, nerve injury and failed or incomplete block.  Patient voices understanding and wishes to proceed.

## 2011-02-17 NOTE — Progress Notes (Signed)
Contractions since 0700 

## 2011-02-17 NOTE — H&P (Signed)
Rachel Carrillo is a 31 y.o. female presenting for SOL. Maternal Medical History:  Reason for admission: Reason for admission: contractions.  Contractions: Onset was yesterday.   Frequency: regular.   Perceived severity is moderate.    Fetal activity: Perceived fetal activity is normal.   Last perceived fetal movement was within the past hour.    Prenatal Complications - Diabetes: none.    OB History    Grav Para Term Preterm Abortions TAB SAB Ect Mult Living   6 2 2  3  3   2      Past Medical History  Diagnosis Date  . No pertinent past medical history    Past Surgical History  Procedure Date  . Wisdom tooth extraction    Family History: family history is not on file. Social History:  reports that she has been smoking.  She has never used smokeless tobacco. She reports that she does not drink alcohol or use illicit drugs.  Review of Systems  All other systems reviewed and are negative.    Dilation: 3 Effacement (%): 70 Station: -3 Exam by:: S. Carrera, RNC Blood pressure 110/76, pulse 82, temperature 98.2 F (36.8 C), resp. rate 18, height 5\' 4"  (1.626 m), weight 84.369 kg (186 lb). Maternal Exam:  Uterine Assessment: Contraction strength is moderate.  Contraction duration is 30 seconds. Contraction frequency is regular.   Abdomen: Fundal height is term.   Estimated fetal weight is 7.5#.   Fetal presentation: vertex  Introitus: Ferning test: not done.  Nitrazine test: not done.  Pelvis: adequate for delivery.   Cervix: Cervix evaluated by digital exam.     Fetal Exam Fetal Monitor Review: Mode: hand-held doppler probe.   Baseline rate: 140s.  Variability: moderate (6-25 bpm).   Pattern: no decelerations and no accelerations.    Fetal State Assessment: Category I - tracings are normal.     Physical Exam  Constitutional: She is oriented to person, place, and time. She appears well-developed and well-nourished.  Respiratory: Effort normal.  GI:  Soft. Bowel sounds are normal. She exhibits no distension and no mass. There is no tenderness. There is no rebound and no guarding.  Musculoskeletal: Normal range of motion.  Neurological: She is alert and oriented to person, place, and time.  Skin: Skin is warm and dry.  Psychiatric: She has a normal mood and affect. Her behavior is normal. Judgment and thought content normal.    Prenatal labs: ABO, Rh: O/Positive/-- (06/04 0000) Antibody: Negative (06/04 0000) Rubella: Immune (06/04 0000) RPR: NON REACTIVE (01/22 2213)  HBsAg: Negative (06/04 0000)  HIV: Non-reactive (06/04 0000)  GBS: Negative (01/23 0122)   Assessment/Plan: 1.  IUP at 40 weeks 2.  Active Labor.  Will admit and provide expectant management.  Breast feed.  Desires PP BTL.     STINSON, JACOB JEHIEL 02/17/2011, 1:36 PM

## 2011-02-17 NOTE — Anesthesia Preprocedure Evaluation (Signed)
Anesthesia Evaluation  Patient identified by MRN, date of birth, ID band Patient awake    Reviewed: Allergy & Precautions, H&P , NPO status , Patient's Chart, lab work & pertinent test results, reviewed documented beta blocker date and time   History of Anesthesia Complications Negative for: history of anesthetic complications  Airway Mallampati: II TM Distance: >3 FB Neck ROM: full    Dental  (+) Teeth Intact   Pulmonary Current Smoker,  clear to auscultation        Cardiovascular neg cardio ROS regular Normal    Neuro/Psych Negative Neurological ROS  Negative Psych ROS   GI/Hepatic Neg liver ROS, GERD-  Medicated and Controlled,  Endo/Other  Negative Endocrine ROS  Renal/GU negative Renal ROS  Genitourinary negative   Musculoskeletal   Abdominal   Peds  Hematology negative hematology ROS (+)   Anesthesia Other Findings   Reproductive/Obstetrics (+) Pregnancy                           Anesthesia Physical Anesthesia Plan  ASA: II  Anesthesia Plan: Epidural   Post-op Pain Management:    Induction:   Airway Management Planned:   Additional Equipment:   Intra-op Plan:   Post-operative Plan:   Informed Consent: I have reviewed the patients History and Physical, chart, labs and discussed the procedure including the risks, benefits and alternatives for the proposed anesthesia with the patient or authorized representative who has indicated his/her understanding and acceptance.     Plan Discussed with:   Anesthesia Plan Comments:         Anesthesia Quick Evaluation

## 2011-02-18 ENCOUNTER — Inpatient Hospital Stay (HOSPITAL_COMMUNITY): Payer: Medicaid Other | Admitting: Anesthesiology

## 2011-02-18 ENCOUNTER — Encounter (HOSPITAL_COMMUNITY): Payer: Self-pay | Admitting: Anesthesiology

## 2011-02-18 ENCOUNTER — Encounter (HOSPITAL_COMMUNITY)
Admission: AD | Disposition: A | Discharge status: Home / Self Care | Payer: Self-pay | Source: Ambulatory Visit | Attending: Family Medicine

## 2011-02-18 DIAGNOSIS — Z302 Encounter for sterilization: Secondary | ICD-10-CM

## 2011-02-18 DIAGNOSIS — Z9851 Tubal ligation status: Secondary | ICD-10-CM

## 2011-02-18 HISTORY — PX: TUBAL LIGATION: SHX77

## 2011-02-18 LAB — SURGICAL PCR SCREEN
MRSA, PCR: NEGATIVE
Staphylococcus aureus: POSITIVE — AB

## 2011-02-18 LAB — CBC
MCH: 25.1 pg — ABNORMAL LOW (ref 26.0–34.0)
MCHC: 31.6 g/dL (ref 30.0–36.0)
MCV: 79.4 fL (ref 78.0–100.0)
Platelets: 191 10*3/uL (ref 150–400)

## 2011-02-18 SURGERY — LIGATION, FALLOPIAN TUBE, POSTPARTUM
Anesthesia: Epidural | Site: Abdomen | Laterality: Bilateral | Wound class: Clean

## 2011-02-18 MED ORDER — FENTANYL CITRATE 0.05 MG/ML IJ SOLN: 25.0000 ug | INTRAMUSCULAR | Status: DC | PRN

## 2011-02-18 MED ORDER — DIBUCAINE 1 % RE OINT: 1.0000 "application " | TOPICAL_OINTMENT | RECTAL | Status: DC | PRN

## 2011-02-18 MED ORDER — KETOROLAC TROMETHAMINE 30 MG/ML IJ SOLN
INTRAMUSCULAR | Status: DC | PRN
  Administered 2011-02-18: 30 mg via INTRAVENOUS
  Administered 2011-02-18: 30 mg via INTRAMUSCULAR

## 2011-02-18 MED ORDER — METHYLERGONOVINE MALEATE 0.2 MG/ML IJ SOLN: 0.2000 mg | INTRAMUSCULAR | Status: DC | PRN

## 2011-02-18 MED ORDER — MIDAZOLAM HCL 5 MG/5ML IJ SOLN
INTRAMUSCULAR | Status: DC | PRN
  Administered 2011-02-18 (×2): 1 mg via INTRAVENOUS

## 2011-02-18 MED ORDER — METOCLOPRAMIDE HCL 5 MG/ML IJ SOLN: 10.0000 mg | Freq: Once | INTRAMUSCULAR | Status: DC | PRN

## 2011-02-18 MED ORDER — METHYLERGONOVINE MALEATE 0.2 MG/ML IJ SOLN
INTRAMUSCULAR | Status: AC
  Administered 2011-02-18: 0.2 mg via INTRAMUSCULAR
  Filled 2011-02-18: qty 1

## 2011-02-18 MED ORDER — TETANUS-DIPHTH-ACELL PERTUSSIS 5-2.5-18.5 LF-MCG/0.5 IM SUSP: 0.5000 mL | Freq: Once | INTRAMUSCULAR | Status: DC

## 2011-02-18 MED ORDER — PROPOFOL 10 MG/ML IV EMUL
INTRAVENOUS | Status: AC
  Filled 2011-02-18: qty 20

## 2011-02-18 MED ORDER — IBUPROFEN 600 MG PO TABS
600.0000 mg | ORAL_TABLET | Freq: Four times a day (QID) | ORAL | Status: DC
  Administered 2011-02-18 – 2011-02-19 (×5): 600 mg via ORAL
  Filled 2011-02-18 (×5): qty 1

## 2011-02-18 MED ORDER — METHYLERGONOVINE MALEATE 0.2 MG PO TABS: 0.2000 mg | ORAL_TABLET | ORAL | Status: DC | PRN

## 2011-02-18 MED ORDER — FENTANYL CITRATE 0.05 MG/ML IJ SOLN
INTRAMUSCULAR | Status: AC
  Filled 2011-02-18: qty 2

## 2011-02-18 MED ORDER — ONDANSETRON HCL 4 MG/2ML IJ SOLN
INTRAMUSCULAR | Status: DC | PRN
  Administered 2011-02-18: 4 mg via INTRAVENOUS

## 2011-02-18 MED ORDER — LACTATED RINGERS IV SOLN
INTRAVENOUS | Status: DC | PRN
  Administered 2011-02-18: 10:00:00 via INTRAVENOUS

## 2011-02-18 MED ORDER — FENTANYL CITRATE 0.05 MG/ML IJ SOLN
100.0000 ug | Freq: Once | INTRAMUSCULAR | Status: AC
  Administered 2011-02-18: 100 ug via INTRAVENOUS

## 2011-02-18 MED ORDER — PROPOFOL 10 MG/ML IV EMUL
INTRAVENOUS | Status: DC | PRN
  Administered 2011-02-18 (×2): 20 mg via INTRAVENOUS

## 2011-02-18 MED ORDER — MISOPROSTOL 200 MCG PO TABS
1000.0000 ug | ORAL_TABLET | Freq: Once | ORAL | Status: AC
  Administered 2011-02-18: 1000 ug via RECTAL

## 2011-02-18 MED ORDER — ONDANSETRON HCL 4 MG/2ML IJ SOLN: 4.0000 mg | INTRAMUSCULAR | Status: DC | PRN

## 2011-02-18 MED ORDER — BUPIVACAINE HCL (PF) 0.5 % IJ SOLN
INTRAMUSCULAR | Status: DC | PRN
  Administered 2011-02-18: 20 mL

## 2011-02-18 MED ORDER — METOCLOPRAMIDE HCL 10 MG PO TABS
10.0000 mg | ORAL_TABLET | Freq: Once | ORAL | Status: AC
  Administered 2011-02-18: 10 mg via ORAL
  Filled 2011-02-18: qty 1

## 2011-02-18 MED ORDER — LACTATED RINGERS IV SOLN
Freq: Once | INTRAVENOUS | Status: AC
  Administered 2011-02-18: 20 mL/h via INTRAVENOUS

## 2011-02-18 MED ORDER — FAMOTIDINE 20 MG PO TABS
40.0000 mg | ORAL_TABLET | Freq: Once | ORAL | Status: AC
  Administered 2011-02-18: 40 mg via ORAL
  Filled 2011-02-18: qty 2

## 2011-02-18 MED ORDER — MEPERIDINE HCL 25 MG/ML IJ SOLN: 6.2500 mg | INTRAMUSCULAR | Status: DC | PRN

## 2011-02-18 MED ORDER — LIDOCAINE HCL (CARDIAC) 20 MG/ML IV SOLN
INTRAVENOUS | Status: DC | PRN
  Administered 2011-02-18: 40 mg via INTRAVENOUS

## 2011-02-18 MED ORDER — KETOROLAC TROMETHAMINE 30 MG/ML IJ SOLN
INTRAMUSCULAR | Status: AC
  Filled 2011-02-18: qty 1

## 2011-02-18 MED ORDER — SIMETHICONE 80 MG PO CHEW: 80.0000 mg | CHEWABLE_TABLET | ORAL | Status: DC | PRN

## 2011-02-18 MED ORDER — DIPHENHYDRAMINE HCL 25 MG PO CAPS: 25.0000 mg | ORAL_CAPSULE | Freq: Four times a day (QID) | ORAL | Status: DC | PRN

## 2011-02-18 MED ORDER — ONDANSETRON HCL 4 MG PO TABS: 4.0000 mg | ORAL_TABLET | ORAL | Status: DC | PRN

## 2011-02-18 MED ORDER — ZOLPIDEM TARTRATE 5 MG PO TABS: 5.0000 mg | ORAL_TABLET | Freq: Every evening | ORAL | Status: DC | PRN

## 2011-02-18 MED ORDER — SENNOSIDES-DOCUSATE SODIUM 8.6-50 MG PO TABS
2.0000 | ORAL_TABLET | Freq: Every day | ORAL | Status: DC
  Administered 2011-02-18: 2 via ORAL

## 2011-02-18 MED ORDER — SODIUM BICARBONATE 8.4 % IV SOLN
INTRAVENOUS | Status: AC
  Filled 2011-02-18: qty 50

## 2011-02-18 MED ORDER — LIDOCAINE-EPINEPHRINE (PF) 2 %-1:200000 IJ SOLN
INTRAMUSCULAR | Status: AC
  Filled 2011-02-18: qty 20

## 2011-02-18 MED ORDER — FENTANYL CITRATE 0.05 MG/ML IJ SOLN
INTRAMUSCULAR | Status: DC | PRN
  Administered 2011-02-18 (×2): 50 ug via INTRAVENOUS

## 2011-02-18 MED ORDER — PRENATAL MULTIVITAMIN CH
1.0000 | ORAL_TABLET | Freq: Every day | ORAL | Status: DC
  Administered 2011-02-19: 1 via ORAL
  Filled 2011-02-18: qty 1

## 2011-02-18 MED ORDER — WITCH HAZEL-GLYCERIN EX PADS: 1.0000 "application " | MEDICATED_PAD | CUTANEOUS | Status: DC | PRN

## 2011-02-18 MED ORDER — LIDOCAINE HCL (CARDIAC) 20 MG/ML IV SOLN
INTRAVENOUS | Status: AC
  Filled 2011-02-18: qty 5

## 2011-02-18 MED ORDER — MIDAZOLAM HCL 2 MG/2ML IJ SOLN
INTRAMUSCULAR | Status: AC
  Filled 2011-02-18: qty 2

## 2011-02-18 MED ORDER — KETOROLAC TROMETHAMINE 30 MG/ML IJ SOLN
INTRAMUSCULAR | Status: AC
Start: 2011-02-18 — End: 2011-02-18
  Filled 2011-02-18: qty 1

## 2011-02-18 MED ORDER — ONDANSETRON HCL 4 MG/2ML IJ SOLN
INTRAMUSCULAR | Status: AC
  Filled 2011-02-18: qty 2

## 2011-02-18 MED ORDER — BENZOCAINE-MENTHOL 20-0.5 % EX AERO: 1.0000 "application " | INHALATION_SPRAY | CUTANEOUS | Status: DC | PRN

## 2011-02-18 MED ORDER — LANOLIN HYDROUS EX OINT: TOPICAL_OINTMENT | CUTANEOUS | Status: DC | PRN

## 2011-02-18 MED ORDER — OXYCODONE-ACETAMINOPHEN 5-325 MG PO TABS
1.0000 | ORAL_TABLET | ORAL | Status: DC | PRN
  Administered 2011-02-18 (×2): 1 via ORAL
  Filled 2011-02-18 (×2): qty 1

## 2011-02-18 MED ORDER — BUPIVACAINE HCL (PF) 0.5 % IJ SOLN
INTRAMUSCULAR | Status: AC
  Filled 2011-02-18: qty 30

## 2011-02-18 MED ORDER — SODIUM CHLORIDE 0.9 % IR SOLN
Status: DC | PRN
  Administered 2011-02-18: 1000 mL

## 2011-02-18 MED ORDER — SODIUM BICARBONATE 8.4 % IV SOLN
INTRAVENOUS | Status: DC | PRN
  Administered 2011-02-18: 5 mL via EPIDURAL

## 2011-02-18 MED ORDER — MISOPROSTOL 200 MCG PO TABS
ORAL_TABLET | ORAL | Status: AC
  Filled 2011-02-18: qty 5

## 2011-02-18 SURGICAL SUPPLY — 27 items
APL SKNCLS STERI-STRIP NONHPOA (GAUZE/BANDAGES/DRESSINGS)
BENZOIN TINCTURE PRP APPL 2/3 (GAUZE/BANDAGES/DRESSINGS) IMPLANT
CHLORAPREP W/TINT 26ML (MISCELLANEOUS) ×2 IMPLANT
CLIP FILSHIE TUBAL LIGA STRL (Clip) ×2 IMPLANT
CLOTH BEACON ORANGE TIMEOUT ST (SAFETY) ×2 IMPLANT
DRSG COVADERM PLUS 2X2 (GAUZE/BANDAGES/DRESSINGS) ×1 IMPLANT
ELECT REM PT RETURN 9FT ADLT (ELECTROSURGICAL) ×2
ELECTRODE REM PT RTRN 9FT ADLT (ELECTROSURGICAL) IMPLANT
GLOVE BIO SURGEON STRL SZ7 (GLOVE) ×2 IMPLANT
GLOVE BIOGEL PI IND STRL 7.0 (GLOVE) ×2 IMPLANT
GLOVE BIOGEL PI INDICATOR 7.0 (GLOVE) ×2
GOWN PREVENTION PLUS LG XLONG (DISPOSABLE) ×4 IMPLANT
NDL HYPO 25X1 1.5 SAFETY (NEEDLE) ×1 IMPLANT
NEEDLE HYPO 25X1 1.5 SAFETY (NEEDLE) ×2 IMPLANT
NS IRRIG 1000ML POUR BTL (IV SOLUTION) ×2 IMPLANT
PACK ABDOMINAL MINOR (CUSTOM PROCEDURE TRAY) ×2 IMPLANT
PENCIL BUTTON HOLSTER BLD 10FT (ELECTRODE) ×2 IMPLANT
SPONGE LAP 4X18 X RAY DECT (DISPOSABLE) ×1 IMPLANT
STRIP CLOSURE SKIN 1/2X4 (GAUZE/BANDAGES/DRESSINGS) IMPLANT
SUT CHROMIC 2 0 TIES 18 (SUTURE) IMPLANT
SUT VIC AB 0 CT1 27 (SUTURE) ×2
SUT VIC AB 0 CT1 27XBRD ANBCTR (SUTURE) ×1 IMPLANT
SUT VICRYL 4-0 PS2 18IN ABS (SUTURE) ×2 IMPLANT
SYR CONTROL 10ML LL (SYRINGE) ×2 IMPLANT
TOWEL OR 17X24 6PK STRL BLUE (TOWEL DISPOSABLE) ×4 IMPLANT
TRAY FOLEY CATH 14FR (SET/KITS/TRAYS/PACK) ×2 IMPLANT
WATER STERILE IRR 1000ML POUR (IV SOLUTION) ×1 IMPLANT

## 2011-02-18 NOTE — Anesthesia Postprocedure Evaluation (Signed)
  Anesthesia Post-op Note  Patient: Rachel Carrillo  Procedure(s) Performed:  POST PARTUM TUBAL LIGATION - Post partum tubal ligation bilateral with filshie clips.  Patient Location: PACU and Mother/Baby  Anesthesia Type: Epidural  Level of Consciousness: awake, alert  and oriented  Airway and Oxygen Therapy: Patient Spontanous Breathing  Post-op Assessment: Patient's Cardiovascular Status Stable and Respiratory Function Stable  Post-op Vital Signs: stable  Complications: No apparent anesthesia complications 

## 2011-02-18 NOTE — Anesthesia Postprocedure Evaluation (Signed)
  Anesthesia Post-op Note  Patient: Rachel Carrillo  Procedure(s) Performed:  POST PARTUM TUBAL LIGATION - Post partum tubal ligation bilateral with filshie clips.  Patient Location: PACU  Anesthesia Type: Epidural  Level of Consciousness: awake, alert  and oriented  Airway and Oxygen Therapy: Patient Spontanous Breathing  Post-op Pain: none  Post-op Assessment: Post-op Vital signs reviewed, Patient's Cardiovascular Status Stable, Respiratory Function Stable, Patent Airway, No signs of Nausea or vomiting, Pain level controlled, No headache and No backache  Post-op Vital Signs: Reviewed and stable  Complications: No apparent anesthesia complications

## 2011-02-18 NOTE — Addendum Note (Signed)
Addendum  created 02/18/11 1754 by Len Blalock, CRNA   Modules edited:Notes Section

## 2011-02-18 NOTE — Transfer of Care (Signed)
Immediate Anesthesia Transfer of Care Note  Patient: Rachel Carrillo  Procedure(s) Performed:  POST PARTUM TUBAL LIGATION - Post partum tubal ligation bilateral with filshie clips.  Patient Location: PACU  Anesthesia Type: Epidural  Level of Consciousness: awake, alert , oriented and patient cooperative  Airway & Oxygen Therapy: Patient Spontanous Breathing  Post-op Assessment: Post -op Vital signs reviewed and stable, Report to RN when available  Post vital signs: Reviewed  Complications: No apparent anesthesia complications

## 2011-02-18 NOTE — Anesthesia Postprocedure Evaluation (Signed)
  Anesthesia Post-op Note  Patient: Rachel Carrillo  Procedure(s) Performed: * Lumbar Epidural for L&D *  Patient Location: PACU and Mother/Baby  Anesthesia Type: Epidural  Level of Consciousness: awake, alert  and oriented  Airway and Oxygen Therapy: Patient Spontanous Breathing  Post-op Pain: none  Post-op Assessment: Post-op Vital signs reviewed, Patient's Cardiovascular Status Stable, Respiratory Function Stable, Patent Airway, No signs of Nausea or vomiting, Pain level controlled, No headache, No backache, No residual numbness and No residual motor weakness  Post-op Vital Signs: Reviewed and stable  Complications: No apparent anesthesia complications

## 2011-02-18 NOTE — Progress Notes (Signed)
Patient c/o back soreness and abdominal cramping. RN offered ibuprofen but patient questions whether she should take it with the amount of bleeding she has had. Dr. Garen Lah called. Okay for ibuprofen. Instructed this RN to place order for CBC this morning and NPO. Will continue to monitor patient.

## 2011-02-18 NOTE — Progress Notes (Signed)
Post Partum Day 1 (less than 24 hrs) Subjective: no complaints and up ad lib . Patient has no complaints., denies any lightheadedness, CP, SHOB Bleeding has decreased significantly.  Patient NPO, to have BTL today  Objective: Blood pressure 141/92, pulse 87, temperature 99.5 F (37.5 C), temperature source Oral, resp. rate 20, height 5\' 4"  (1.626 m), weight 186 lb (84.369 kg), SpO2 99.00%, unknown if currently breastfeeding.  Physical Exam:  General: alert, cooperative, appears stated age and no distress Lochia: appropriate Uterine Fundus: firm Incision: N/A DVT Evaluation: No evidence of DVT seen on physical exam. Negative Homan's sign. No cords or calf tenderness. No significant calf/ankle edema.   Basename 02/18/11 0548 02/17/11 1416  HGB 10.1* 10.4*  HCT 32.0* 32.7*    Assessment/Plan: Breastfeeding, Lactation consult and Contraception BTL Patient to have BTL today Post partum hemorrhage: controlled, continue to monitor, consider IVF and recheck H&H if patient symptomatic. Repeat H&H stable this am, though expected since blood loss was acute Routine care today, lactation consult, pain control Anticipate DC home tomorrow if continued improvement.    LOS: 1 day   Cameron Proud 02/18/2011, 6:28 AM

## 2011-02-18 NOTE — Anesthesia Preprocedure Evaluation (Signed)
Anesthesia Evaluation  Patient identified by MRN, date of birth, ID band Patient awake    Reviewed: Allergy & Precautions, H&P , NPO status , Patient's Chart, lab work & pertinent test results, reviewed documented beta blocker date and time   History of Anesthesia Complications Negative for: history of anesthetic complications  Airway Mallampati: II TM Distance: >3 FB Neck ROM: full    Dental No notable dental hx. (+) Teeth Intact   Pulmonary Current Smoker,  clear to auscultation  Pulmonary exam normal       Cardiovascular neg cardio ROS regular Normal    Neuro/Psych Negative Neurological ROS  Negative Psych ROS   GI/Hepatic Neg liver ROS, GERD-  Medicated and Controlled,  Endo/Other  Negative Endocrine ROS  Renal/GU negative Renal ROS  Genitourinary negative   Musculoskeletal   Abdominal Normal abdominal exam  (+)   Peds  Hematology negative hematology ROS (+)   Anesthesia Other Findings   Reproductive/Obstetrics negative OB ROS                           Anesthesia Physical  Anesthesia Plan  ASA: II  Anesthesia Plan: Epidural   Post-op Pain Management:    Induction:   Airway Management Planned:   Additional Equipment:   Intra-op Plan:   Post-operative Plan:   Informed Consent: I have reviewed the patients History and Physical, chart, labs and discussed the procedure including the risks, benefits and alternatives for the proposed anesthesia with the patient or authorized representative who has indicated his/her understanding and acceptance.     Plan Discussed with: Anesthesiologist, CRNA and Surgeon  Anesthesia Plan Comments:         Anesthesia Quick Evaluation

## 2011-02-18 NOTE — Addendum Note (Signed)
Addendum  created 02/18/11 1756 by Len Blalock, CRNA   Modules edited:Notes Section

## 2011-02-18 NOTE — Op Note (Signed)
Rachel Carrillo  PREOPERATIVE DIAGNOSIS:  Multiparity, undesired fertility  POSTOPERATIVE DIAGNOSIS:  Multiparity, undesired fertility  PROCEDURE:  Postpartum Bilateral Tubal Sterilization using Filshie Clips   ANESTHESIA:  Epidural and local analgesia using 0.5% Marcaine  COMPLICATIONS:  None immediate.  ESTIMATED BLOOD LOSS: 5 ml.  FLUIDS: 600 ml LR.  URINE OUTPUT:  100 ml of clear urine.  INDICATIONS: 31 y.o. [id number]  with undesired fertility,status post vaginal delivery, desires permanent sterilization.  Other reversible forms of contraception were discussed with patient; she declines all other modalities. Risks of procedure discussed with patient including but not limited to: risk of regret, permanence of method, bleeding, infection, injury to surrounding organs and need for additional procedures.  Failure risk of 0.5-1% with increased risk of ectopic gestation if pregnancy occurs was also discussed with patient.     FINDINGS:  Normal uterus, tubes, and ovaries. Small left paratubal cyst.  PROCEDURE DETAILS: The patient was taken to the operating room where her epidural anesthesia was dosed up to surgical level and found to be adequate.  She was then placed in the dorsal supine position and prepped and draped in sterile fashion.  After an adequate timeout was performed, attention was turned to the patient's abdomen where a small transverse skin incision was made under the umbilical fold. The incision was taken down to the layer of fascia using the scalpel, and fascia was incised, and extended bilaterally using Mayo scissors. The peritoneum was entered in a sharp fashion. Attention was then turned to the patient's uterus, and left fallopian tube was identified and followed out to the fimbriated end.  A Filshie clip was placed on the left fallopian tube about 2 cm from the cornual attachment, with care given to incorporate the underlying mesosalpinx.  A similar process was carried out on  the right side allowing for bilateral tubal sterilization.  There was a small paratubal cyst was excised without complication; it was benign in appearance and was not sent to pathology.   Good hemostasis was noted overall.  Local analgesia was injected into both Filshie application sites.The instruments were then removed from the patient's abdomen and the fascial incision was repaired with 0 Vicryl, and the skin was closed with a 4-0 Vicryl subcuticular stitch. The patient tolerated the procedure well.  Instrument, sponge, and needle counts were correct times two.  The patient was then taken to the recovery room awake and in stable condition.  Hildreth Orsak A 02/18/2011 10:33 AM

## 2011-02-18 NOTE — Preoperative (Signed)
Beta Blockers   Reason not to administer Beta Blockers:Not Applicable 

## 2011-02-18 NOTE — Anesthesia Postprocedure Evaluation (Signed)
  Anesthesia Post-op Note  Patient: Rachel Carrillo  Procedure(s) Performed:  POST PARTUM TUBAL LIGATION - Post partum tubal ligation bilateral with filshie clips.  Patient Location: PACU and Mother/Baby  Anesthesia Type: Epidural  Level of Consciousness: awake, alert  and oriented  Airway and Oxygen Therapy: Patient Spontanous Breathing  Post-op Assessment: Patient's Cardiovascular Status Stable and Respiratory Function Stable  Post-op Vital Signs: stable  Complications: No apparent anesthesia complications

## 2011-02-19 NOTE — Discharge Summary (Signed)
Agree with above note.  Rachel Carrillo H. 02/19/2011 7:29 PM

## 2011-02-19 NOTE — Progress Notes (Signed)
Post Partum Day 2; post op day 1 Subjective: no complaints, up ad lib, voiding, tolerating PO and + flatus  breast feeding going well. Patient denies SHOB, CP, leg pain.   Objective: Blood pressure 126/89, pulse 62, temperature 97.6 F (36.4 C), temperature source Oral, resp. rate 20, height 5\' 4"  (1.626 m), weight 84.369 kg (186 lb), SpO2 97.00%, unknown if currently breastfeeding.  Physical Exam:  General: alert, cooperative, appears stated age and no distress Lochia: appropriate Uterine Fundus: firm Incision: healing well, no significant drainage, no dehiscence, no significant erythema DVT Evaluation: No evidence of DVT seen on physical exam. Negative Homan's sign. No cords or calf tenderness. No significant calf/ankle edema.   Basename 02/18/11 0548 02/17/11 1416  HGB 10.1* 10.4*  HCT 32.0* 32.7*    Assessment/Plan: Discharge home and Contraception BTL Pain control: ibuprofen followup in 6wks   LOS: 2 days   Cameron Proud 02/19/2011, 7:15 AM

## 2011-02-19 NOTE — Discharge Summary (Signed)
Obstetric Discharge Summary Reason for Admission: onset of labor Prenatal Procedures: none Intrapartum Procedures: spontaneous vaginal delivery Postpartum Procedures: P.P. tubal ligation Complications-Operative and Postpartum: hemorrhage Hemoglobin  Date Value Range Status  02/18/2011 10.1* 12.0-15.0 (g/dL) Final     HCT  Date Value Range Status  02/18/2011 32.0* 36.0-46.0 (%) Final    Discharge Diagnoses: Term Pregnancy-delivered  Discharge Information: Date: 02/19/2011 Activity: unrestricted Diet: routine Medications: PNV, Percocet and acyclovir, omeprazole, ambien Condition: stable Instructions: refer to practice specific booklet Discharge to: home Follow-up Information    Follow up with [facility] in 6 weeks.   Contact information:   [address] [place] [address]          Newborn Data: Live born female  Birth Weight: 9 lb 13.3 oz (4459 g) APGAR: 8, 8  Home with mother.  Rachel Carrillo 02/19/2011, 7:53 AM

## 2011-02-20 ENCOUNTER — Encounter (HOSPITAL_COMMUNITY): Payer: Self-pay | Admitting: Obstetrics & Gynecology

## 2011-02-20 NOTE — Progress Notes (Signed)
Post discharge chart review completed.  

## 2012-03-15 ENCOUNTER — Emergency Department (HOSPITAL_COMMUNITY)
Admission: EM | Admit: 2012-03-15 | Discharge: 2012-03-15 | Disposition: A | Discharge status: Home / Self Care | Payer: Medicaid Other | Attending: Emergency Medicine | Admitting: Emergency Medicine

## 2012-03-15 ENCOUNTER — Encounter (HOSPITAL_COMMUNITY): Payer: Self-pay

## 2012-03-15 ENCOUNTER — Emergency Department (HOSPITAL_COMMUNITY): Payer: Medicaid Other

## 2012-03-15 DIAGNOSIS — Z87891 Personal history of nicotine dependence: Secondary | ICD-10-CM | POA: Insufficient documentation

## 2012-03-15 DIAGNOSIS — Z79899 Other long term (current) drug therapy: Secondary | ICD-10-CM | POA: Insufficient documentation

## 2012-03-15 DIAGNOSIS — N61 Mastitis without abscess: Secondary | ICD-10-CM

## 2012-03-15 DIAGNOSIS — Z8619 Personal history of other infectious and parasitic diseases: Secondary | ICD-10-CM | POA: Insufficient documentation

## 2012-03-15 MED ORDER — AZITHROMYCIN 250 MG PO TABS: ORAL_TABLET | ORAL | Status: DC

## 2012-03-15 NOTE — ED Notes (Signed)
Pt reports is breast feeding and was diagnosed with mastitis by Urgent care doctor.  Reports the doctor told her she had an abscess that would require surgery.

## 2012-03-18 NOTE — ED Provider Notes (Signed)
History     CSN: [id number]  Arrival date & time 03/15/12  1040   First MD Initiated Contact with Patient 03/15/12 1104      Chief Complaint  Patient presents with  . Breast Pain    (Consider location/radiation/quality/duration/timing/severity/associated sxs/prior treatment) HPI Comments: Patient c/o pain to her right breast for several days.  States that she was seen at a local urgent care just prior to ED arrival stating that she was instructed to come here because she had an abscess to her breast that would need to be drained surgically.  Patient states she has been breast feeding for 13 months and continues to produce milk from the affected breast.  She denies fever, vomiting or chills.  She also denies redness to the breast or asymmetry  The history is provided by the patient.    Past Medical History  Diagnosis Date  . HSV-2 infection     Past Surgical History  Procedure Laterality Date  . Wisdom tooth extraction    . Tubal ligation  02/18/2011    Procedure: POST PARTUM TUBAL LIGATION;  Surgeon: [name], MD;  Location: [facility];  Service: Gynecology;  Laterality: Bilateral;  Post partum tubal ligation bilateral with filshie clips.    No family history on file.  History  Substance Use Topics  . Smoking status: Former Smoker -- 0.25 packs/day  . Smokeless tobacco: Never Used  . Alcohol Use: No    OB History   Grav Para Term Preterm Abortions TAB SAB Ect Mult Living   6 3 3  3  3   3       Review of Systems  Constitutional: Negative for fever, chills, activity change and appetite change.  HENT: Negative for facial swelling, neck pain and neck stiffness.   Respiratory: Negative for cough, chest tightness and shortness of breath.   Cardiovascular: Negative for chest pain.       Right breast tenderness  Gastrointestinal: Negative for nausea, vomiting and abdominal pain.  Genitourinary: Negative for dysuria and flank pain.  Skin: Negative for color change,  rash and wound.  Neurological: Negative for dizziness and headaches.  Hematological: Negative for adenopathy.  All other systems reviewed and are negative.    Allergies  Review of patient's allergies indicates no known allergies.  Home Medications   Current Outpatient Rx  Name  Route  Sig  Dispense  Refill  . flintstones complete (FLINTSTONES) 60 MG chewable tablet   Oral   Chew 2 tablets by mouth daily.         Marland Kitchen azithromycin (ZITHROMAX Z-PAK) 250 MG tablet      Take two tablets on day one, then one tab qd days 2-5   6 each   0     BP 127/93  Pulse 82  Temp(Src) 98.3 F (36.8 C) (Oral)  Resp 20  Ht 5\' 4"  (1.626 m)  Wt 170 lb (77.111 kg)  BMI 29.17 kg/m2  SpO2 97%  LMP 03/08/2012  Breastfeeding? Yes  Physical Exam  Nursing note and vitals reviewed. Constitutional: She is oriented to person, place, and time.  Cardiovascular: Normal rate, regular rhythm, normal heart sounds and intact distal pulses.   No murmur heard. Pulmonary/Chest: Effort normal and breath sounds normal. No respiratory distress. She has no wheezes. She exhibits tenderness. She exhibits no bony tenderness, no crepitus, no edema, no deformity, no swelling and no retraction. Right breast exhibits tenderness. Right breast exhibits no inverted nipple, no mass, no nipple discharge and  no skin change. Left breast exhibits no inverted nipple, no mass, no nipple discharge and no skin change. Breasts are symmetrical.    Musculoskeletal: Normal range of motion.  Neurological: She is oriented to person, place, and time. She exhibits normal muscle tone. Coordination normal.  Skin: Skin is warm and dry.    ED Course  Procedures (including critical care time)  Labs Reviewed - No data to display No results found.   US Breast Right  03/15/2012  *RADIOLOGY REPORT*  Clinical Data: Breast pain in a nursing patient.  CHEST ULTRASOUND  Comparison:  None.  Findings: Limited ultrasound imaging of the right  breast was performed.  No focal fluid collection or mass is identified.  IMPRESSION: Negative for abscess or acute abnormality.  If clinical concern persists, the patient should return next Wednesday, 03/20/2012 for evaluation by a breast imager.   Original Report Authenticated By: Holley Dexter, M.D.     1. Mastitis       MDM   Mild ttp of the upper right breast.  No erythema, excessive warmth.  No palpable masses or nipple discharge.  Pt does have fibrous changes of the breast bilaterally.  I do not clinically suspect abscess, likely mastitis. Will order US of the breast.  Discussed pt hx , imaging result and care plan with the EDP.  Pt agrees to warm soaks, will prescribe azithromycin And pt agrees to close f/u with Dr. Emelda Fear.    The patient appears reasonably screened and/or stabilized for discharge and I doubt any other medical condition or other [facility] requiring further screening, evaluation, or treatment in the ED at this time prior to discharge.     Rachel Schoenfeldt L. Crystalee Ventress, PA 03/18/12 1800

## 2012-03-19 NOTE — ED Provider Notes (Signed)
Medical screening examination/treatment/procedure(s) were performed by non-physician practitioner and as supervising physician I was immediately available for consultation/collaboration.   Shelda Jakes, MD 03/19/12 [phone]

## 2012-05-24 ENCOUNTER — Encounter: Payer: Self-pay | Admitting: *Deleted

## 2012-07-02 ENCOUNTER — Other Ambulatory Visit: Payer: Self-pay | Admitting: Obstetrics & Gynecology

## 2012-07-04 ENCOUNTER — Other Ambulatory Visit: Payer: Self-pay | Admitting: Obstetrics & Gynecology

## 2012-07-10 ENCOUNTER — Encounter: Payer: Self-pay | Admitting: *Deleted

## 2012-07-11 ENCOUNTER — Ambulatory Visit (INDEPENDENT_AMBULATORY_CARE_PROVIDER_SITE_OTHER): Payer: Medicaid Other | Admitting: Obstetrics & Gynecology

## 2012-07-11 ENCOUNTER — Encounter: Payer: Self-pay | Admitting: Obstetrics & Gynecology

## 2012-07-11 VITALS — BP 110/80 | Ht 64.0 in | Wt 174.0 lb

## 2012-07-11 DIAGNOSIS — Z0000 Encounter for general adult medical examination without abnormal findings: Secondary | ICD-10-CM

## 2012-07-11 DIAGNOSIS — Z01419 Encounter for gynecological examination (general) (routine) without abnormal findings: Secondary | ICD-10-CM

## 2012-07-11 MED ORDER — NORGESTIM-ETH ESTRAD TRIPHASIC 0.18/0.215/0.25 MG-25 MCG PO TABS: 1.0000 | ORAL_TABLET | Freq: Every day | ORAL | Status: DC

## 2012-07-11 NOTE — Progress Notes (Signed)
Patient ID: Rachel Carrillo, female   DOB: 1980/02/14, 32 y.o.   MRN: [id number] Subjective:     Rachel Carrillo is a 32 y.o. female here for a routine exam.  Patient's last menstrual period was 06/11/2012. [id number] Current complaints: menorrhagia.  Personal health questionnaire reviewed: no.   Gynecologic History Patient's last menstrual period was 06/11/2012. Contraception: tubal ligation Last Pap: 2013. Results were: normal Last mammogram: na. Results were: na  Obstetric History OB History   Grav Para Term Preterm Abortions TAB SAB Ect Mult Living   6 3 3  3  3   3      # Outc Date GA Lbr Len/2nd Wgt Sex Del Anes PTL Lv   1 TRM 1/13 102w0d 15:10 / 00:29 9lb13.3oz(4.459kg) F SVD EPI  Yes   Comments: wnl   2 TRM            3 TRM            4 SAB            5 SAB            6 SAB                The following portions of the patient's history were reviewed and updated as appropriate: allergies, current medications, past family history, past medical history, past social history, past surgical history and problem list.  Review of Systems  Review of Systems  Constitutional: Negative for fever, chills, weight loss, malaise/fatigue and diaphoresis.  HENT: Negative for hearing loss, ear pain, nosebleeds, congestion, sore throat, neck pain, tinnitus and ear discharge.   Eyes: Negative for blurred vision, double vision, photophobia, pain, discharge and redness.  Respiratory: Negative for cough, hemoptysis, sputum production, shortness of breath, wheezing and stridor.   Cardiovascular: Negative for chest pain, palpitations, orthopnea, claudication, leg swelling and PND.  Gastrointestinal: negative for abdominal pain. Negative for heartburn, nausea, vomiting, diarrhea, constipation, blood in stool and melena.  Genitourinary: Negative for dysuria, urgency, frequency, hematuria and flank pain.  Musculoskeletal: Negative for myalgias, back pain, joint pain and falls.  Skin: Negative for  itching and rash.  Neurological: Negative for dizziness, tingling, tremors, sensory change, speech change, focal weakness, seizures, loss of consciousness, weakness and headaches.  Endo/Heme/Allergies: Negative for environmental allergies and polydipsia. Does not bruise/bleed easily.  Psychiatric/Behavioral: Negative for depression, suicidal ideas, hallucinations, memory loss and substance abuse. The patient is not nervous/anxious and does not have insomnia.        Objective:    Physical Exam  Vitals reviewed. Constitutional: She is oriented to person, place, and time. She appears well-developed and well-nourished.  HENT:  Head: Normocephalic and atraumatic.        Right Ear: External ear normal.  Left Ear: External ear normal.  Nose: Nose normal.  Mouth/Throat: Oropharynx is clear and moist.  Eyes: Conjunctivae and EOM are normal. Pupils are equal, round, and reactive to light. Right eye exhibits no discharge. Left eye exhibits no discharge. No scleral icterus.  Neck: Normal range of motion. Neck supple. No tracheal deviation present. No thyromegaly present.  Cardiovascular: Normal rate, regular rhythm, normal heart sounds and intact distal pulses.  Exam reveals no gallop and no friction rub.   No murmur heard. Respiratory: Effort normal and breath sounds normal. No respiratory distress. She has no wheezes. She has no rales. She exhibits no tenderness.  GI: Soft. Bowel sounds are normal. She exhibits no distension and no mass. There is no tenderness.  There is no rebound and no guarding.  Genitourinary:       Vulva is normal without lesions Vagina is pink moist without discharge Cervix normal in appearance and pap is done Uterus is normal size shape and contour Adnexa is negative with normal sized ovaries   Musculoskeletal: Normal range of motion. She exhibits no edema and no tenderness.  Neurological: She is alert and oriented to person, place, and time. She has normal reflexes. She  displays normal reflexes. No cranial nerve deficit. She exhibits normal muscle tone. Coordination normal.  Skin: Skin is warm and dry. No rash noted. No erythema. No pallor.  Psychiatric: She has a normal mood and affect. Her behavior is normal. Judgment and thought content normal.       Assessment:    Healthy female exam.    Plan:    Follow up in: 1 year. OCP for cycle control

## 2012-07-11 NOTE — Patient Instructions (Signed)

## 2012-09-11 ENCOUNTER — Encounter (HOSPITAL_COMMUNITY): Payer: Self-pay

## 2012-09-11 ENCOUNTER — Emergency Department (HOSPITAL_COMMUNITY)
Admission: EM | Admit: 2012-09-11 | Discharge: 2012-09-11 | Disposition: A | Discharge status: Home / Self Care | Payer: Medicaid Other | Attending: Emergency Medicine | Admitting: Emergency Medicine

## 2012-09-11 DIAGNOSIS — N390 Urinary tract infection, site not specified: Secondary | ICD-10-CM | POA: Insufficient documentation

## 2012-09-11 DIAGNOSIS — Z87891 Personal history of nicotine dependence: Secondary | ICD-10-CM | POA: Insufficient documentation

## 2012-09-11 DIAGNOSIS — Z79899 Other long term (current) drug therapy: Secondary | ICD-10-CM | POA: Insufficient documentation

## 2012-09-11 DIAGNOSIS — Z8619 Personal history of other infectious and parasitic diseases: Secondary | ICD-10-CM | POA: Insufficient documentation

## 2012-09-11 DIAGNOSIS — R350 Frequency of micturition: Secondary | ICD-10-CM | POA: Insufficient documentation

## 2012-09-11 DIAGNOSIS — R3989 Other symptoms and signs involving the genitourinary system: Secondary | ICD-10-CM | POA: Insufficient documentation

## 2012-09-11 DIAGNOSIS — R300 Dysuria: Secondary | ICD-10-CM | POA: Insufficient documentation

## 2012-09-11 LAB — URINALYSIS, ROUTINE W REFLEX MICROSCOPIC
Nitrite: NEGATIVE
Specific Gravity, Urine: <1.005 — ABNORMAL LOW (ref 1.005–1.030)
Urobilinogen, UA: 0.2 mg/dL (ref 0.0–1.0)
pH: 6 (ref 5.0–8.0)

## 2012-09-11 LAB — URINE MICROSCOPIC-ADD ON

## 2012-09-11 MED ORDER — PHENAZOPYRIDINE HCL 200 MG PO TABS: 200.0000 mg | ORAL_TABLET | Freq: Three times a day (TID) | ORAL | Status: DC

## 2012-09-11 MED ORDER — SULFAMETHOXAZOLE-TRIMETHOPRIM 800-160 MG PO TABS: 1.0000 | ORAL_TABLET | Freq: Two times a day (BID) | ORAL | Status: DC

## 2012-09-11 MED ORDER — PHENAZOPYRIDINE HCL 100 MG PO TABS
200.0000 mg | ORAL_TABLET | Freq: Once | ORAL | Status: AC
  Administered 2012-09-11: 200 mg via ORAL
  Filled 2012-09-11: qty 2

## 2012-09-11 MED ORDER — SULFAMETHOXAZOLE-TMP DS 800-160 MG PO TABS
1.0000 | ORAL_TABLET | Freq: Once | ORAL | Status: AC
  Administered 2012-09-11: 1 via ORAL
  Filled 2012-09-11: qty 1

## 2012-09-11 NOTE — ED Provider Notes (Signed)
CSN: [id number]     Arrival date & time 09/11/12  4098 History     First MD Initiated Contact with Patient 09/11/12 0630     Chief Complaint  Patient presents with  . Back Pain   (Consider location/radiation/quality/duration/timing/severity/associated sxs/prior Treatment) HPI  Patient reports 3 days ago she started having frequency, dysuria with burning, smelling urine and that also appears cloudy. She denies seeing blood in her urine or when she wipes. She also complains of lower back pain on the right side. She states sometimes she gets some suprapubic pain that is crampy in nature but does not persist. She denies nausea, vomiting, or fever. She states she's had urinary tract infections before mainly when she was pregnant. She reports she is seeing her GYN recently and had a Pap smear done. She denies vaginal discharge.   Patient states she is still breast-feeding her baby who is 54 months old. She states the baby is allergic to penicillin and amoxicillin.  PCP Dr Loney Hering GYN Dr Despina Hidden  Past Medical History  Diagnosis Date  . HSV-2 infection    Past Surgical History  Procedure Laterality Date  . Wisdom tooth extraction    . Tubal ligation  02/18/2011    Procedure: POST PARTUM TUBAL LIGATION;  Surgeon: [name], MD;  Location: [facility];  Service: Gynecology;  Laterality: Bilateral;  Post partum tubal ligation bilateral with filshie clips.   Family History  Problem Relation Age of Onset  . Hypertension Mother   . Other Mother     TIA  . Diabetes Maternal Aunt    History  Substance Use Topics  . Smoking status: Former Smoker -- 0.25 packs/day  . Smokeless tobacco: Never Used  . Alcohol Use: No  lives at home unemployed   OB History   Grav Para Term Preterm Abortions TAB SAB Ect Mult Living   6 3 3  3  3   3      Review of Systems  All other systems reviewed and are negative.    Allergies  Review of patient's allergies indicates no known allergies.  Home  Medications   Current Outpatient Rx  Name  Route  Sig  Dispense  Refill  . flintstones complete (FLINTSTONES) 60 MG chewable tablet   Oral   Chew 2 tablets by mouth daily.          BP 125/85  Pulse 74  Temp(Src) 98.1 F (36.7 C) (Oral)  Resp 20  Ht 5\' 4"  (1.626 m)  Wt 176 lb (79.833 kg)  BMI 30.2 kg/m2  SpO2 100%  LMP 08/19/2012  Vital signs normal   Physical Exam  Nursing note and vitals reviewed. Constitutional: She is oriented to person, place, and time. She appears well-developed and well-nourished.  Non-toxic appearance. She does not appear ill. No distress.  HENT:  Head: Normocephalic and atraumatic.  Right Ear: External ear normal.  Left Ear: External ear normal.  Nose: Nose normal. No mucosal edema or rhinorrhea.  Mouth/Throat: Oropharynx is clear and moist and mucous membranes are normal. No dental abscesses or edematous.  Eyes: Conjunctivae and EOM are normal. Pupils are equal, round, and reactive to light.  Neck: Normal range of motion and full passive range of motion without pain. Neck supple.  Cardiovascular: Normal rate, regular rhythm and normal heart sounds.  Exam reveals no gallop and no friction rub.   No murmur heard. Pulmonary/Chest: Effort normal and breath sounds normal. No respiratory distress. She has no wheezes. She has no  rhonchi. She has no rales. She exhibits no tenderness and no crepitus.  Abdominal: Soft. Normal appearance and bowel sounds are normal. She exhibits no distension. There is no tenderness. There is no rebound and no guarding.  Minimal lower suprapubic pain to palpation  Musculoskeletal: Normal range of motion. She exhibits no edema and no tenderness.  Moves all extremities well. No CVAT, has pain over the right lower sacral region  Neurological: She is alert and oriented to person, place, and time. She has normal strength. No cranial nerve deficit.  Skin: Skin is warm, dry and intact. No rash noted. No erythema. No pallor.   Psychiatric: She has a normal mood and affect. Her speech is normal and behavior is normal. Her mood appears not anxious.    ED Course   Medications  phenazopyridine (PYRIDIUM) tablet 200 mg (not administered)  sulfamethoxazole-trimethoprim (BACTRIM DS) 800-160 MG per tablet 1 tablet (not administered)     Procedures (including critical care time)  Review of old records shows her last urine culture was in Feb 2009 and she grew out > 100K colonies of E Coli sensitive to all antibiotics tested.    Results for orders placed during the hospital encounter of 09/11/12  URINALYSIS, ROUTINE W REFLEX MICROSCOPIC      Result Value Range   Color, Urine YELLOW  YELLOW   APPearance CLEAR  CLEAR   Specific Gravity, Urine <1.005 (*) 1.005 - 1.030   pH 6.0  5.0 - 8.0   Glucose, UA NEGATIVE  NEGATIVE mg/dL   Hgb urine dipstick MODERATE (*) NEGATIVE   Bilirubin Urine NEGATIVE  NEGATIVE   Ketones, ur NEGATIVE  NEGATIVE mg/dL   Protein, ur NEGATIVE  NEGATIVE mg/dL   Urobilinogen, UA 0.2  0.0 - 1.0 mg/dL   Nitrite NEGATIVE  NEGATIVE   Leukocytes, UA MODERATE (*) NEGATIVE  URINE MICROSCOPIC-ADD ON      Result Value Range   WBC, UA TOO NUMEROUS TO COUNT  <3 WBC/hpf   RBC / HPF TOO NUMEROUS TO COUNT  <3 RBC/hpf   Bacteria, UA MANY (*) RARE   Laboratory interpretation all normal except UTI     1. UTI (urinary tract infection)      New Prescriptions   PHENAZOPYRIDINE (PYRIDIUM) 200 MG TABLET    Take 1 tablet (200 mg total) by mouth 3 (three) times daily.   SULFAMETHOXAZOLE-TRIMETHOPRIM (SEPTRA DS) 800-160 MG PER TABLET    Take 1 tablet by mouth 2 (two) times daily.     Plan discharge   Devoria Albe, MD, FACEP   MDM    Ward Givens, MD 09/11/12 [phone]

## 2012-09-11 NOTE — ED Notes (Signed)
Patient given discharge instruction, verbalized understand. Patient ambulatory out of the department.  

## 2012-09-11 NOTE — ED Notes (Signed)
Lower back pain, frequency, urgency, burning with urination

## 2012-09-12 LAB — URINE CULTURE: Colony Count: 25000

## 2012-09-13 NOTE — ED Notes (Signed)
+   Urine  Patient treated with Sulfa-Trim-ok-chart appended per protocol MD.  

## 2012-11-28 ENCOUNTER — Other Ambulatory Visit: Payer: Self-pay

## 2013-02-13 ENCOUNTER — Ambulatory Visit (INDEPENDENT_AMBULATORY_CARE_PROVIDER_SITE_OTHER): Payer: Medicaid Other | Admitting: Otolaryngology

## 2013-02-13 DIAGNOSIS — H93299 Other abnormal auditory perceptions, unspecified ear: Secondary | ICD-10-CM

## 2013-02-13 DIAGNOSIS — H6980 Other specified disorders of Eustachian tube, unspecified ear: Secondary | ICD-10-CM

## 2013-03-13 ENCOUNTER — Ambulatory Visit (INDEPENDENT_AMBULATORY_CARE_PROVIDER_SITE_OTHER): Payer: Medicaid Other | Admitting: Otolaryngology

## 2013-03-13 DIAGNOSIS — H6980 Other specified disorders of Eustachian tube, unspecified ear: Secondary | ICD-10-CM

## 2013-06-05 ENCOUNTER — Ambulatory Visit (INDEPENDENT_AMBULATORY_CARE_PROVIDER_SITE_OTHER): Payer: Medicaid Other | Admitting: Otolaryngology

## 2013-06-05 DIAGNOSIS — H6980 Other specified disorders of Eustachian tube, unspecified ear: Secondary | ICD-10-CM

## 2013-08-07 ENCOUNTER — Ambulatory Visit (INDEPENDENT_AMBULATORY_CARE_PROVIDER_SITE_OTHER): Payer: Medicaid Other | Admitting: Otolaryngology

## 2013-08-07 DIAGNOSIS — H6980 Other specified disorders of Eustachian tube, unspecified ear: Secondary | ICD-10-CM

## 2013-11-24 ENCOUNTER — Encounter (HOSPITAL_COMMUNITY): Payer: Self-pay

## 2014-01-01 ENCOUNTER — Other Ambulatory Visit (HOSPITAL_COMMUNITY)
Admission: RE | Admit: 2014-01-01 | Discharge: 2014-01-01 | Disposition: A | Discharge status: Home / Self Care | Payer: Medicaid Other | Source: Ambulatory Visit | Attending: Obstetrics & Gynecology | Admitting: Obstetrics & Gynecology

## 2014-01-01 ENCOUNTER — Encounter: Payer: Self-pay | Admitting: Obstetrics & Gynecology

## 2014-01-01 ENCOUNTER — Ambulatory Visit (INDEPENDENT_AMBULATORY_CARE_PROVIDER_SITE_OTHER): Payer: Medicaid Other | Admitting: Obstetrics & Gynecology

## 2014-01-01 VITALS — BP 136/80 | Ht 64.0 in | Wt 180.0 lb

## 2014-01-01 DIAGNOSIS — Z0000 Encounter for general adult medical examination without abnormal findings: Secondary | ICD-10-CM

## 2014-01-01 DIAGNOSIS — Z1151 Encounter for screening for human papillomavirus (HPV): Secondary | ICD-10-CM | POA: Insufficient documentation

## 2014-01-01 DIAGNOSIS — Z01419 Encounter for gynecological examination (general) (routine) without abnormal findings: Secondary | ICD-10-CM | POA: Diagnosis present

## 2014-01-01 MED ORDER — SILVER SULFADIAZINE 1 % EX CREA: TOPICAL_CREAM | CUTANEOUS | Status: DC

## 2014-01-01 MED ORDER — DESOGESTREL-ETHINYL ESTRADIOL 0.15-30 MG-MCG PO TABS: 1.0000 | ORAL_TABLET | Freq: Every day | ORAL | Status: DC

## 2014-01-01 NOTE — Progress Notes (Signed)
Patient ID: Rachel Carrillo, female   DOB: [birth year], 33 y.o.   MRN: [id number] Subjective:     Rachel Carrillo is a 33 y.o. female here for a routine exam.  Patient's last menstrual period was 12/10/2013. [id number] Birth Control Method:  BTL Menstrual Calendar(currently): regular heavier  Current complaints: periods.   Current acute medical issues:  psoriasis   Recent Gynecologic History Patient's last menstrual period was 12/10/2013. Last Pap: 2014,  normal Last mammogram: ,    Past Medical History  Diagnosis Date  . HSV-2 infection     Past Surgical History  Procedure Laterality Date  . Wisdom tooth extraction    . Tubal ligation  02/18/2011    Procedure: POST PARTUM TUBAL LIGATION;  Surgeon: Tereso NewcomerUgonna A Anyanwu, MD;  Location: [facility];  Service: Gynecology;  Laterality: Bilateral;  Post partum tubal ligation bilateral with filshie clips.    OB History    Gravida Para Term Preterm AB TAB SAB Ectopic Multiple Living   6 3 3  3  3   3       History   Social History  . Marital Status: Legally Separated    Spouse Name: N/A    Number of Children: N/A  . Years of Education: N/A   Social History Main Topics  . Smoking status: Former Smoker -- 0.25 packs/day    Types: Cigarettes  . Smokeless tobacco: Never Used  . Alcohol Use: No  . Drug Use: No  . Sexual Activity: Yes    Birth Control/ Protection: None, Surgical     Comment: tubal   Other Topics Concern  . None   Social History Narrative    Family History  Problem Relation Age of Onset  . Hypertension Mother   . Other Mother     TIA  . Stroke Mother   . Irritable bowel syndrome Mother   . Diabetes Maternal Aunt   . Arthritis Maternal Aunt     rheumatoid  . Arthritis Maternal Uncle     rheumatoid  . Arthritis Maternal Grandmother     rheumatoid  . Crohn's disease Maternal Grandfather   . Cancer Maternal Grandfather   . Heart disease Paternal Grandfather   . Other Paternal Grandfather     was on  oxygen    Current outpatient prescriptions: silver sulfADIAZINE (SILVADENE) 1 % cream, Use to area BID, Disp: 50 g, Rfl: 11  Review of Systems  Review of Systems  Constitutional: Negative for fever, chills, weight loss, malaise/fatigue and diaphoresis.  HENT: Negative for hearing loss, ear pain, nosebleeds, congestion, sore throat, neck pain, tinnitus and ear discharge.   Eyes: Negative for blurred vision, double vision, photophobia, pain, discharge and redness.  Respiratory: Negative for cough, hemoptysis, sputum production, shortness of breath, wheezing and stridor.   Cardiovascular: Negative for chest pain, palpitations, orthopnea, claudication, leg swelling and PND.  Gastrointestinal: negative for abdominal pain. Negative for heartburn, nausea, vomiting, diarrhea, constipation, blood in stool and melena.  Genitourinary: Negative for dysuria, urgency, frequency, hematuria and flank pain.  Musculoskeletal: Negative for myalgias, back pain, joint pain and falls.  Skin: Negative for itching and rash.  Neurological: Negative for dizziness, tingling, tremors, sensory change, speech change, focal weakness, seizures, loss of consciousness, weakness and headaches.  Endo/Heme/Allergies: Negative for environmental allergies and polydipsia. Does not bruise/bleed easily.  Psychiatric/Behavioral: Negative for depression, suicidal ideas, hallucinations, memory loss and substance abuse. The patient is not nervous/anxious and does not have insomnia.  Objective:  Blood pressure 136/80, height 5\' 4"  (1.626 m), weight 180 lb (81.647 kg), last menstrual period 12/10/2013.   Physical Exam  Vitals reviewed. Constitutional: She is oriented to person, place, and time. She appears well-developed and well-nourished.  HENT:  Head: Normocephalic and atraumatic.        Right Ear: External ear normal.  Left Ear: External ear normal.  Nose: Nose normal.  Mouth/Throat: Oropharynx is clear and moist.   Eyes: Conjunctivae and EOM are normal. Pupils are equal, round, and reactive to light. Right eye exhibits no discharge. Left eye exhibits no discharge. No scleral icterus.  Neck: Normal range of motion. Neck supple. No tracheal deviation present. No thyromegaly present.  Cardiovascular: Normal rate, regular rhythm, normal heart sounds and intact distal pulses.  Exam reveals no gallop and no friction rub.   No murmur heard. Respiratory: Effort normal and breath sounds normal. No respiratory distress. She has no wheezes. She has no rales. She exhibits no tenderness.  GI: Soft. Bowel sounds are normal. She exhibits no distension and no mass. There is no tenderness. There is no rebound and no guarding.  Genitourinary:  Breasts no masses skin changes or nipple changes bilaterally      Vulva is normal without lesions Vagina is pink moist without discharge Cervix normal in appearance and pap is done Uterus is normal size shape and contour Adnexa is negative with normal sized ovaries    Musculoskeletal: Normal range of motion. She exhibits no edema and no tenderness.  Neurological: She is alert and oriented to person, place, and time. She has normal reflexes. She displays normal reflexes. No cranial nerve deficit. She exhibits normal muscle tone. Coordination normal.  Skin: Skin is warm and dry. No rash noted. No erythema. No pallor.  Psychiatric: She has a normal mood and affect. Her behavior is normal. Judgment and thought content normal.       Assessment:    Healthy female exam.    Plan:    Contraception: tubal ligation. Follow up in: 1 year. silvadene cream for hands

## 2014-01-02 LAB — CYTOLOGY - PAP

## 2015-06-29 ENCOUNTER — Encounter (HOSPITAL_COMMUNITY): Payer: Self-pay | Admitting: Emergency Medicine

## 2015-06-29 ENCOUNTER — Emergency Department (HOSPITAL_COMMUNITY)
Admission: EM | Admit: 2015-06-29 | Discharge: 2015-06-29 | Disposition: A | Discharge status: Home / Self Care | Payer: Medicaid Other | Attending: Emergency Medicine | Admitting: Emergency Medicine

## 2015-06-29 DIAGNOSIS — R110 Nausea: Secondary | ICD-10-CM | POA: Insufficient documentation

## 2015-06-29 DIAGNOSIS — Z7982 Long term (current) use of aspirin: Secondary | ICD-10-CM | POA: Diagnosis not present

## 2015-06-29 DIAGNOSIS — M545 Low back pain: Secondary | ICD-10-CM | POA: Diagnosis not present

## 2015-06-29 DIAGNOSIS — R300 Dysuria: Secondary | ICD-10-CM

## 2015-06-29 LAB — URINE MICROSCOPIC-ADD ON

## 2015-06-29 LAB — URINALYSIS, ROUTINE W REFLEX MICROSCOPIC
Bilirubin Urine: NEGATIVE
GLUCOSE, UA: NEGATIVE mg/dL
Ketones, ur: NEGATIVE mg/dL
Nitrite: NEGATIVE
PROTEIN: NEGATIVE mg/dL
Specific Gravity, Urine: <1.005 — ABNORMAL LOW (ref 1.005–1.030)
pH: 5.5 (ref 5.0–8.0)

## 2015-06-29 LAB — PREGNANCY, URINE: PREG TEST UR: NEGATIVE

## 2015-06-29 MED ORDER — CEPHALEXIN 500 MG PO CAPS: 500.0000 mg | ORAL_CAPSULE | Freq: Two times a day (BID) | ORAL | Status: DC

## 2015-06-29 MED ORDER — PHENAZOPYRIDINE HCL 100 MG PO TABS
100.0000 mg | ORAL_TABLET | Freq: Three times a day (TID) | ORAL | Status: DC
Start: 2015-06-29 — End: 2017-05-16

## 2015-06-29 MED ORDER — ONDANSETRON 4 MG PO TBDP
4.0000 mg | ORAL_TABLET | Freq: Once | ORAL | Status: AC
  Administered 2015-06-29: 4 mg via ORAL
  Filled 2015-06-29: qty 1

## 2015-06-29 MED ORDER — PHENAZOPYRIDINE HCL 100 MG PO TABS
100.0000 mg | ORAL_TABLET | Freq: Once | ORAL | Status: AC
  Administered 2015-06-29: 100 mg via ORAL
  Filled 2015-06-29: qty 1

## 2015-06-29 NOTE — ED Provider Notes (Signed)
CSN: [id number]     Arrival date & time 06/29/15  [id number] History   First MD Initiated Contact with Patient 06/29/15 [phone]     Chief Complaint  Patient presents with  . Dysuria     (Consider location/radiation/quality/duration/timing/severity/associated sxs/prior Treatment) HPI  This a 35 year old female who presents with dysuria and urinary frequency. Patient reports 2 to three-day history of lower midline back pain and cramping. She states that she woke up this morning with urinary frequency and dysuria. History of urinary tract infections with similar symptoms. Back pain is in the midline and described as crampy. It does not lateralized to the right to the left. Patient denies any fevers. She does endorse nausea. Denies any vaginal discharge. Does not have any concerns for STDs.  Past Medical History  Diagnosis Date  . HSV-2 infection    Past Surgical History  Procedure Laterality Date  . Wisdom tooth extraction    . Tubal ligation  02/18/2011    Procedure: POST PARTUM TUBAL LIGATION;  Surgeon: Tereso NewcomerUgonna A Anyanwu, MD;  Location: [facility];  Service: Gynecology;  Laterality: Bilateral;  Post partum tubal ligation bilateral with filshie clips.   Family History  Problem Relation Age of Onset  . Hypertension Mother   . Other Mother     TIA  . Stroke Mother   . Irritable bowel syndrome Mother   . Diabetes Maternal Aunt   . Arthritis Maternal Aunt     rheumatoid  . Arthritis Maternal Uncle     rheumatoid  . Arthritis Maternal Grandmother     rheumatoid  . Crohn's disease Maternal Grandfather   . Cancer Maternal Grandfather   . Heart disease Paternal Grandfather   . Other Paternal Grandfather     was on oxygen   Social History  Substance Use Topics  . Smoking status: Former Smoker -- 0.25 packs/day    Types: Cigarettes  . Smokeless tobacco: Never Used  . Alcohol Use: No   OB History    Gravida Para Term Preterm AB TAB SAB Ectopic Multiple Living   6 3 3  3  3   3      Review  of Systems  Constitutional: Negative for fever.  Genitourinary: Positive for urgency and difficulty urinating. Negative for flank pain and vaginal discharge.  Musculoskeletal: Positive for back pain.  All other systems reviewed and are negative.     Allergies  Review of patient's allergies indicates no known allergies.  Home Medications   Prior to Admission medications   Medication Sig Start Date End Date Taking? Authorizing Provider  cephALEXin (KEFLEX) 500 MG capsule Take 1 capsule (500 mg total) by mouth 2 (two) times daily. 06/29/15   Shon Batonourtney F Ginny Loomer, MD  desogestrel-ethinyl estradiol (APRI,EMOQUETTE,SOLIA) 0.15-30 MG-MCG tablet Take 1 tablet by mouth daily. 01/01/14   Lazaro ArmsLuther H Eure, MD  phenazopyridine (PYRIDIUM) 100 MG tablet Take 1 tablet (100 mg total) by mouth 3 (three) times daily with meals. 06/29/15   Shon Batonourtney F Keiondre Colee, MD  silver sulfADIAZINE (SILVADENE) 1 % cream Use to area BID 01/01/14   Lazaro ArmsLuther H Eure, MD   BP 126/82 mmHg  Pulse 87  Temp(Src) 98.2 F (36.8 C) (Oral)  Resp 18  Ht 5\' 4"  (1.626 m)  Wt 173 lb (78.472 kg)  BMI 29.68 kg/m2  SpO2 99%  LMP 06/08/2015 Physical Exam  Constitutional: She is oriented to person, place, and time. She appears well-developed and well-nourished. No distress.  HENT:  Head: Normocephalic and atraumatic.  Cardiovascular: Normal rate,  regular rhythm and normal heart sounds.   Pulmonary/Chest: Effort normal and breath sounds normal. No respiratory distress. She has no wheezes.  Abdominal: Soft. There is no tenderness.  Genitourinary:  No CVA tenderness  Neurological: She is alert and oriented to person, place, and time.  Skin: Skin is warm and dry.  Psychiatric: She has a normal mood and affect.  Nursing note and vitals reviewed.   ED Course  Procedures (including critical care time) Labs Review Labs Reviewed  URINALYSIS, ROUTINE W REFLEX MICROSCOPIC (NOT AT [facility]) - Abnormal; Notable for the following:    Specific Gravity,  Urine <1.005 (*)    Hgb urine dipstick TRACE (*)    Leukocytes, UA SMALL (*)    All other components within normal limits  URINE MICROSCOPIC-ADD ON - Abnormal; Notable for the following:    Squamous Epithelial / LPF 0-5 (*)    Bacteria, UA FEW (*)    All other components within normal limits  URINE CULTURE  PREGNANCY, URINE    Imaging Review No results found. I have personally reviewed and evaluated these images and lab results as part of my medical decision-making.   EKG Interpretation None      MDM   Final diagnoses:  Dysuria   Patient presents with dysuria and low back pain. Does not lateralized. Is nontoxic-appearing. History of UTIs with similar symptoms. Urinalysis obtained.  She has 0-5 white cells and few bacteria. No obvious urinary source; however, given the classic nature of her symptoms and history of the same, will elect to send a urine culture and treat. Patient was given Pyridium for discomfort.  After history, exam, and medical workup I feel the patient has been appropriately medically screened and is safe for discharge home. Pertinent diagnoses were discussed with the patient. Patient was given return precautions.     Shon Baton, MD 06/29/15 [phone]

## 2015-06-29 NOTE — Discharge Instructions (Signed)
You were seen to day for discomfort with urination.  While your urinalysis does not show an obvious urinary tract infection, he may have the beginnings 1. He'll be discharged with antibiotics and Pyridium. If you develop fevers worsening back pain or any new or worsening symptoms she should be reevaluated.  Dysuria Dysuria is pain or discomfort while urinating. The pain or discomfort may be felt in the tube that carries urine out of the bladder (urethra) or in the surrounding tissue of the genitals. The pain may also be felt in the groin area, lower abdomen, and lower back. You may have to urinate frequently or have the sudden feeling that you have to urinate (urgency). Dysuria can affect both men and women, but is more common in women. Dysuria can be caused by many different things, including:  Urinary tract infection in women.  Infection of the kidney or bladder.  Kidney stones or bladder stones.  Certain sexually transmitted infections (STIs), such as chlamydia.  Dehydration.  Inflammation of the vagina.  Use of certain medicines.  Use of certain soaps or scented products that cause irritation. HOME CARE INSTRUCTIONS Watch your dysuria for any changes. The following actions may help to reduce any discomfort you are feeling:  Drink enough fluid to keep your urine clear or pale yellow.  Empty your bladder often. Avoid holding urine for long periods of time.  After a bowel movement or urination, women should cleanse from front to back, using each tissue only once.  Empty your bladder after sexual intercourse.  Take medicines only as directed by your health care provider.  If you were prescribed an antibiotic medicine, finish it all even if you start to feel better.  Avoid caffeine, tea, and alcohol. They can irritate the bladder and make dysuria worse. In men, alcohol may irritate the prostate.  Keep all follow-up visits as directed by your health care provider. This is  important.  If you had any tests done to find the cause of dysuria, it is your responsibility to obtain your test results. Ask the lab or department performing the test when and how you will get your results. Talk with your health care provider if you have any questions about your results. SEEK MEDICAL CARE IF:  You develop pain in your back or sides.  You have a fever.  You have nausea or vomiting.  You have blood in your urine.  You are not urinating as often as you usually do. SEEK IMMEDIATE MEDICAL CARE IF:  You pain is severe and not relieved with medicines.  You are unable to hold down any fluids.  You or someone else notices a change in your mental function.  You have a rapid heartbeat at rest.  You have shaking or chills.  You feel extremely weak.   This information is not intended to replace advice given to you by your health care provider. Make sure you discuss any questions you have with your health care provider.   Document Released: 10/08/2003 Document Revised: 01/30/2014 Document Reviewed: 09/04/2013 Elsevier Interactive Patient Education [employer]2016 Elsevier Inc.

## 2015-06-29 NOTE — ED Notes (Signed)
Pt c/o low back pain, nausea, urinary frequency, and dysuria.

## 2015-06-30 LAB — URINE CULTURE: Culture: 3000 — AB

## 2016-10-26 ENCOUNTER — Other Ambulatory Visit: Payer: Self-pay | Admitting: Advanced Practice Midwife

## 2016-11-01 ENCOUNTER — Ambulatory Visit (INDEPENDENT_AMBULATORY_CARE_PROVIDER_SITE_OTHER): Payer: BC Managed Care – PPO | Admitting: Advanced Practice Midwife

## 2016-11-01 ENCOUNTER — Encounter: Payer: Self-pay | Admitting: Advanced Practice Midwife

## 2016-11-01 ENCOUNTER — Other Ambulatory Visit (HOSPITAL_COMMUNITY)
Admission: RE | Admit: 2016-11-01 | Discharge: 2016-11-01 | Disposition: A | Discharge status: Home / Self Care | Payer: BC Managed Care – PPO | Source: Ambulatory Visit | Attending: Advanced Practice Midwife | Admitting: Advanced Practice Midwife

## 2016-11-01 VITALS — BP 110/90 | HR 88 | Ht 64.0 in | Wt 184.0 lb

## 2016-11-01 DIAGNOSIS — Z01419 Encounter for gynecological examination (general) (routine) without abnormal findings: Secondary | ICD-10-CM | POA: Diagnosis present

## 2016-11-01 MED ORDER — TRANEXAMIC ACID 650 MG PO TABS: 1300.0000 mg | ORAL_TABLET | Freq: Three times a day (TID) | ORAL | 6 refills | Status: DC

## 2016-11-01 NOTE — Progress Notes (Signed)
Rachel Carrillo 36 y.o.  There were no vitals filed for this visit.  There were no vitals filed for this visit.  Past Medical History: Past Medical History:  Diagnosis Date  . HSV-2 infection     Past Surgical History: Past Surgical History:  Procedure Laterality Date  . TUBAL LIGATION  02/18/2011   Procedure: POST PARTUM TUBAL LIGATION;  Surgeon: [name], MD;  Location: [facility];  Service: Gynecology;  Laterality: Bilateral;  Post partum tubal ligation bilateral with filshie clips.  . WISDOM TOOTH EXTRACTION      Family History: Family History  Problem Relation Age of Onset  . Hypertension Mother   . Other Mother        TIA  . Stroke Mother   . Irritable bowel syndrome Mother   . Diabetes Maternal Aunt   . Arthritis Maternal Aunt        rheumatoid  . Arthritis Maternal Uncle        rheumatoid  . Arthritis Maternal Grandmother        rheumatoid  . Crohn's disease Maternal Grandfather   . Cancer Maternal Grandfather   . Heart disease Paternal Grandfather   . Other Paternal Grandfather        was on oxygen    Social History: Social History  Substance Use Topics  . Smoking status: Former Smoker    Packs/day: 0.25    Types: Cigarettes  . Smokeless tobacco: Never Used  . Alcohol use No    Allergies: No Known Allergies    Current Outpatient Prescriptions:  .  cephALEXin (KEFLEX) 500 MG capsule, Take 1 capsule (500 mg total) by mouth 2 (two) times daily., Disp: 14 capsule, Rfl: 0 .  desogestrel-ethinyl estradiol (APRI,EMOQUETTE,SOLIA) 0.15-30 MG-MCG tablet, Take 1 tablet by mouth daily., Disp: 1 Package, Rfl: 11 .  phenazopyridine (PYRIDIUM) 100 MG tablet, Take 1 tablet (100 mg total) by mouth 3 (three) times daily with meals., Disp: 10 tablet, Rfl: 0 .  silver sulfADIAZINE (SILVADENE) 1 % cream, Use to area BID, Disp: 50 g, Rfl: 11  History of Present Illness: here for pap and physical.  Had some RLQ pain twice a week for about 2 weeks.  Has BMs  infrequently, sometimes q 2 weeks, but they are soft. Usually doesn't have abdominal pain.  Had BTL, was on COCs for heavy periods but it didn't work. Discussed Lysteda, will try that.    Review of Systems   Patient denies any headaches, blurred vision, shortness of breath, chest pain, abdominal pain, problems with bowel movements, urination, or intercourse.   Physical Exam: General:  Well developed, well nourished, no acute distress Skin:  Warm and dry Neck:  Midline trachea, normal thyroid Lungs; Clear to auscultation bilaterally Breast:  No dominant palpable mass, retraction, or nipple discharge Cardiovascular: Regular rate and rhythm Abdomen:  Soft, non tender, no hepatosplenomegaly Pelvic:  External genitalia is normal in appearance.  The vagina is normal in appearance.  The cervix is bulbous.  Uterus is felt to be normal size, shape, and contour.  No adnexal masses or tenderness noted.  Extremities:  No swelling or varicosities noted Psych:  No mood changes.     Impression: normal GYN Exam ? Ov cyst Metrorhagia  Plan: pap q 3 years if normal If RLQ pain gets worse/more frequent, will check [place] Lysteda for periods

## 2016-11-03 LAB — CYTOLOGY - PAP
DIAGNOSIS: NEGATIVE
HPV (WINDOPATH): NOT DETECTED

## 2017-04-25 ENCOUNTER — Encounter: Payer: Self-pay | Admitting: Advanced Practice Midwife

## 2017-04-25 ENCOUNTER — Ambulatory Visit (INDEPENDENT_AMBULATORY_CARE_PROVIDER_SITE_OTHER): Payer: Commercial Managed Care - PPO | Admitting: Advanced Practice Midwife

## 2017-04-25 VITALS — BP 110/90 | HR 98 | Ht 64.0 in | Wt 192.0 lb

## 2017-04-25 DIAGNOSIS — N924 Excessive bleeding in the premenopausal period: Secondary | ICD-10-CM

## 2017-04-25 DIAGNOSIS — R58 Hemorrhage, not elsewhere classified: Secondary | ICD-10-CM | POA: Diagnosis not present

## 2017-04-25 LAB — POCT HEMOGLOBIN: HEMOGLOBIN: 12.2 g/dL (ref 12.2–16.2)

## 2017-04-25 NOTE — Patient Instructions (Signed)
Endometrial Ablation Endometrial ablation is a procedure that destroys the thin inner layer of the lining of the uterus (endometrium). This procedure may be done:  To stop heavy periods.  To stop bleeding that is causing anemia.  To control irregular bleeding.  To treat bleeding caused by small tumors (fibroids) in the endometrium.  This procedure is often an alternative to major surgery, such as removal of the uterus and cervix (hysterectomy). As a result of this procedure:  You may not be able to have children. However, if you are premenopausal (you have not gone through menopause): ? You may still have a small chance of getting pregnant. ? You will need to use a reliable method of birth control after the procedure to prevent pregnancy.  You may stop having a menstrual period, or you may have only a small amount of bleeding during your period. Menstruation may return several years after the procedure.  Tell a health care provider about:  Any allergies you have.  All medicines you are taking, including vitamins, herbs, eye drops, creams, and over-the-counter medicines.  Any problems you or family members have had with the use of anesthetic medicines.  Any blood disorders you have.  Any surgeries you have had.  Any medical conditions you have. What are the risks? Generally, this is a safe procedure. However, problems may occur, including:  A hole (perforation) in the uterus or bowel.  Infection of the uterus, bladder, or vagina.  Bleeding.  Damage to other structures or organs.  An air bubble in the lung (air embolus).  Problems with pregnancy after the procedure.  Failure of the procedure.  Decreased ability to diagnose cancer in the endometrium.  What happens before the procedure?  You will have tests of your endometrium to make sure there are no pre-cancerous cells or cancer cells present.  You may have an ultrasound of the uterus.  You may be given  medicines to thin the endometrium.  Ask your health care provider about: ? Changing or stopping your regular medicines. This is especially important if you take diabetes medicines or blood thinners. ? Taking medicines such as aspirin and ibuprofen. These medicines can thin your blood. Do not take these medicines before your procedure if your doctor tells you not to.  Plan to have someone take you home from the hospital or clinic. What happens during the procedure?  You will lie on an exam table with your feet and legs supported as in a pelvic exam.  To lower your risk of infection: ? Your health care team will wash or sanitize their hands and put on germ-free (sterile) gloves. ? Your genital area will be washed with soap.  An IV tube will be inserted into one of your veins.  You will be given a medicine to help you relax (sedative).  A surgical instrument with a light and camera (resectoscope) will be inserted into your vagina and moved into your uterus. This allows your surgeon to see inside your uterus.  Endometrial tissue will be removed using one of the following methods: ? Radiofrequency. This method uses a radiofrequency-alternating electric current to remove the endometrium. ? Cryotherapy. This method uses extreme cold to freeze the endometrium. ? Heated-free liquid. This method uses a heated saltwater (saline) solution to remove the endometrium. ? Microwave. This method uses high-energy microwaves to heat up the endometrium and remove it. ? Thermal balloon. This method involves inserting a catheter with a balloon tip into the uterus. The balloon tip is   filled with heated fluid to remove the endometrium. The procedure may vary among health care providers and hospitals. What happens after the procedure?  Your blood pressure, heart rate, breathing rate, and blood oxygen level will be monitored until the medicines you were given have worn off.  As tissue healing occurs, you may  notice vaginal bleeding for 4-6 weeks after the procedure. You may also experience: ? Cramps. ? Thin, watery vaginal discharge that is light pink or brown in color. ? A need to urinate more frequently than usual. ? Nausea.  Do not drive for 24 hours if you were given a sedative.  Do not have sex or insert anything into your vagina until your health care provider approves. Summary  Endometrial ablation is done to treat the many causes of heavy menstrual bleeding.  The procedure may be done only after medications have been tried to control the bleeding.  Plan to have someone take you home from the hospital or clinic. This information is not intended to replace advice given to you by your health care provider. Make sure you discuss any questions you have with your health care provider. Document Released: 11/19/2003 Document Revised: 01/27/2016 Document Reviewed: 01/27/2016 Elsevier Interactive Patient Education  2017 Elsevier Inc.  

## 2017-04-25 NOTE — Progress Notes (Signed)
[facility] Visit  Patient name: Rachel IhaJennifer I Hirata MRN [id number]  Date of birth: [birth year]  CC & HPI:  Rachel Carrillo is a 37 y.o. Caucasian female presenting today for passing clots on Friday  States that one was about 7-8 inches long (?decidual cast?). . Was seen in October, c/o menorrhagia at that time. Has had BTL, offered and accepted trial of Lysteda. Stated that it shortened periods to about 8-=9 days, and lightened it some, but doesn't sound like enough.  Has already tried COCs.  Interested in endo ablation at this point.   Pertinent History Reviewed:  Medical & Surgical Hx:   Past Medical History:  Diagnosis Date  . HSV-2 infection    Past Surgical History:  Procedure Laterality Date  . TUBAL LIGATION  02/18/2011   Procedure: POST PARTUM TUBAL LIGATION;  Surgeon: Tereso NewcomerUgonna A Anyanwu, MD;  Location: [facility];  Service: Gynecology;  Laterality: Bilateral;  Post partum tubal ligation bilateral with filshie clips.  . WISDOM TOOTH EXTRACTION     Family History  Problem Relation Age of Onset  . Hypertension Mother   . Other Mother        TIA  . Stroke Mother   . Irritable bowel syndrome Mother   . Diabetes Maternal Aunt   . Arthritis Maternal Aunt        rheumatoid  . Arthritis Maternal Uncle        rheumatoid  . Arthritis Maternal Grandmother        rheumatoid  . Crohn's disease Maternal Grandfather   . Cancer Maternal Grandfather   . Heart disease Paternal Grandfather   . Other Paternal Grandfather        was on oxygen    Current Outpatient Medications:  .  tranexamic acid (LYSTEDA) 650 MG TABS tablet, Take 2 tablets (1,300 mg total) by mouth 3 (three) times daily., Disp: 30 tablet, Rfl: 6 .  cephALEXin (KEFLEX) 500 MG capsule, Take 1 capsule (500 mg total) by mouth 2 (two) times daily. (Patient not taking: Reported on 11/01/2016), Disp: 14 capsule, Rfl: 0 .  phenazopyridine (PYRIDIUM) 100 MG tablet, Take 1 tablet (100 mg total) by mouth 3 (three) times daily  with meals. (Patient not taking: Reported on 11/01/2016), Disp: 10 tablet, Rfl: 0 .  silver sulfADIAZINE (SILVADENE) 1 % cream, Use to area BID (Patient not taking: Reported on 11/01/2016), Disp: 50 g, Rfl: 11 Social History: Reviewed -  reports that she has quit smoking. Her smoking use included cigarettes. She smoked 0.25 packs per day. She has never used smokeless tobacco.  Review of Systems:   Constitutional: Negative for fever and chills Eyes: Negative for visual disturbances Respiratory: Negative for shortness of breath, dyspnea Cardiovascular: Negative for chest pain or palpitations  Gastrointestinal: Negative for vomiting, diarrhea and constipation; no abdominal pain Genitourinary: Negative for dysuria and urgency, vaginal irritation or itching Musculoskeletal: Negative for back pain, joint pain, myalgias  Neurological: Negative for dizziness and headaches    Objective Findings:    Physical Examination: General appearance - well appearing, and in no distress Mental status - alert, oriented to person, place, and time Chest:  Normal respiratory effort Heart - normal rate and regular rhythm Abdomen:  Soft, nontender Pelvic: deferred, period over and plan for US Musculoskeletal:  Normal range of motion without pain Extremities:  No edema   Results for orders placed or performed in visit on 04/25/17 (from the past 24 hour(s))  POCT hemoglobin   Collection Time: 04/25/17  9:51 AM  Result Value Ref Range   Hemoglobin 12.2 12.2 - 16.2 g/dL      Assessment & Plan:  A:   Mennorhagia P:     Return for ASAP: pelvic US/[facility] appt : pre op endo ablation (would like a 4/17 surgery date if possible).  Scarlette Calico Cresenzo-Dishmon CNM 04/25/2017 10:14 AM

## 2017-04-30 ENCOUNTER — Other Ambulatory Visit: Payer: Self-pay | Admitting: Advanced Practice Midwife

## 2017-04-30 DIAGNOSIS — N924 Excessive bleeding in the premenopausal period: Secondary | ICD-10-CM

## 2017-05-01 ENCOUNTER — Ambulatory Visit (INDEPENDENT_AMBULATORY_CARE_PROVIDER_SITE_OTHER): Payer: Commercial Managed Care - PPO | Admitting: Obstetrics & Gynecology

## 2017-05-01 ENCOUNTER — Encounter: Payer: Self-pay | Admitting: Obstetrics & Gynecology

## 2017-05-01 ENCOUNTER — Ambulatory Visit (INDEPENDENT_AMBULATORY_CARE_PROVIDER_SITE_OTHER): Payer: Commercial Managed Care - PPO

## 2017-05-01 VITALS — BP 130/80 | HR 98 | Wt 191.0 lb

## 2017-05-01 DIAGNOSIS — N924 Excessive bleeding in the premenopausal period: Secondary | ICD-10-CM

## 2017-05-01 DIAGNOSIS — N888 Other specified noninflammatory disorders of cervix uteri: Secondary | ICD-10-CM | POA: Diagnosis not present

## 2017-05-01 DIAGNOSIS — N946 Dysmenorrhea, unspecified: Secondary | ICD-10-CM

## 2017-05-01 DIAGNOSIS — N921 Excessive and frequent menstruation with irregular cycle: Secondary | ICD-10-CM | POA: Diagnosis not present

## 2017-05-01 MED ORDER — MEGESTROL ACETATE 40 MG PO TABS: ORAL_TABLET | ORAL | 3 refills | Status: DC

## 2017-05-01 NOTE — Progress Notes (Signed)
PELVIC US TA/TV:normal homogeneous anteverted uterus,normal endometrium 12 mm,simple nabothian cyst 7 x 5 x 6 mm,normal right ovary,normal left ovary,no free fluid,ovaries appear mobile,no pain during ultrasound

## 2017-05-01 NOTE — Progress Notes (Signed)
Patient ID: Rachel Carrillo, female   DOB: 11-Jul-1980, 37 y.o.   MRN: [id number] Follow up appointment for results  Chief Complaint  Patient presents with  . Pre-op Exam    endometrial ablation/ ultrasound today    Blood pressure 130/80, pulse 98, weight 191 lb (86.6 kg), last menstrual period 04/18/2017.    GYNECOLOGIC SONOGRAM   Rachel Carrillo is a 37 y.o. [id number] LMP 04/18/2017,she is here for a pelvic sonogram for menorrhagia.  Uterus                      9 x 4.7 x 6.7 cm, volume 148 ml,normal homogeneous anteverted uterus  Endometrium          12 mm, symmetrical, normal,no color flow   Right ovary             3.3 x 2 x 2.5 cm, normal  Left ovary                3 x 2.1 x 2.3 cm, normal  No free fluid   Technician Comments:  PELVIC US TA/TV:normal homogeneous anteverted uterus,normal endometrium 12 mm,simple nabothian cyst 7 x 5 x 6 mm,normal right ovary,normal left ovary,no free fluid,ovaries appear mobile,no pain during ultrasound   [employer] 05/01/2017 9:16 AM  Clinical Impression and recommendations:  I have reviewed the sonogram results above, combined with the patient's current clinical course, below are my impressions and any appropriate recommendations for management based on the sonographic findings.  Normal uterus in size and appearance Endometrium is homogenous, normal width in a menstruating woman Both ovaries appear normal in size and appearance   [name] 05/02/2017 7:29 AM   MEDS ordered this encounter: Meds ordered this encounter  Medications  . DISCONTD: megestrol (MEGACE) 40 MG tablet    Sig: 3 tablets a day for 5 days, 2 tablets a day for 5 days then 1 tablet daily    Dispense:  45 tablet    Refill:  3    Orders for this encounter: No orders of the defined types were placed in this encounter.   Impression: Menometrorrhagia  Dysmenorrhea     Plan: Hysteroscopy uterine curettage Minerva endometrial  ablation 05/17/2107  Follow Up: Return in about 3 weeks (around 05/24/2017) for Post Op.       Face to face time:  10 minutes  Greater than 50% of the visit time was spent in counseling and coordination of care with the patient.  The summary and outline of the counseling and care coordination is summarized in the note above.   All questions were answered.  Past Medical History:  Diagnosis Date  . HSV-2 infection     Past Surgical History:  Procedure Laterality Date  . ENDOMETRIAL ABLATION N/A 05/16/2017   Procedure: ENDOMETRIAL ABLATION WITH MINERVA;  Surgeon: [name], MD;  Location: [facility];  Service: Gynecology;  Laterality: N/A;  . HYSTEROSCOPY W/D&C N/A 05/16/2017   Procedure: DILATATION AND CURETTAGE /HYSTEROSCOPY;  Surgeon: [name], MD;  Location: [facility];  Service: Gynecology;  Laterality: N/A;  . TUBAL LIGATION  02/18/2011   Procedure: POST PARTUM TUBAL LIGATION;  Surgeon: [name], MD;  Location: [facility];  Service: Gynecology;  Laterality: Bilateral;  Post partum tubal ligation bilateral with filshie clips.  . WISDOM TOOTH EXTRACTION      OB History    Gravida  6   Para  3   Term  3  Preterm      AB  3   Living  3     SAB  3   TAB      Ectopic      Multiple      Live Births  1           No Known Allergies  Social History   Socioeconomic History  . Marital status: Legally Separated    Spouse name: Not on file  . Number of children: Not on file  . Years of education: Not on file  . Highest education level: Not on file  Occupational History  . Not on file  Social Needs  . Financial resource strain: Not on file  . Food insecurity:    Worry: Not on file    Inability: Not on file  . Transportation needs:    Medical: Not on file    Non-medical: Not on file  Tobacco Use  . Smoking status: Former Smoker    Packs/day: 0.25    Types: Cigarettes  . Smokeless tobacco: Never Used  Substance and Sexual Activity  . Alcohol  use: No  . Drug use: No  . Sexual activity: Not Currently    Birth control/protection: None, Surgical    Comment: tubal  Lifestyle  . Physical activity:    Days per week: Not on file    Minutes per session: Not on file  . Stress: Not on file  Relationships  . Social connections:    Talks on phone: Not on file    Gets together: Not on file    Attends religious service: Not on file    Active member of club or organization: Not on file    Attends meetings of clubs or organizations: Not on file    Relationship status: Not on file  Other Topics Concern  . Not on file  Social History Narrative  . Not on file    Family History  Problem Relation Age of Onset  . Hypertension Mother   . Other Mother        TIA  . Stroke Mother   . Irritable bowel syndrome Mother   . Diabetes Maternal Aunt   . Arthritis Maternal Aunt        rheumatoid  . Arthritis Maternal Uncle        rheumatoid  . Arthritis Maternal Grandmother        rheumatoid  . Crohn's disease Maternal Grandfather   . Cancer Maternal Grandfather   . Heart disease Paternal Grandfather   . Other Paternal Grandfather        was on oxygen

## 2017-05-09 ENCOUNTER — Other Ambulatory Visit: Payer: Self-pay | Admitting: Obstetrics & Gynecology

## 2017-05-10 NOTE — Patient Instructions (Signed)
Rachel Carrillo  05/10/2017     @PREFPERIOPPHARMACY @   Your procedure is scheduled on  05/16/2017   Report to [name] at  1040  A.M.  Call this number if you have problems the morning of surgery:  [phone]   Remember:  Do not eat food or drink liquids after midnight.  Take these medicines the morning of surgery with A SIP OF WATER  Pyridium.   Do not wear jewelry, make-up or nail polish.  Do not wear lotions, powders, or perfumes, or deodorant.  Do not shave 48 hours prior to surgery.  Men may shave face and neck.  Do not bring valuables to the hospital.  [facility] not [facility] any belongings or valuables.  Contacts, dentures or bridgework may not be worn into surgery.  Leave your suitcase in the car.  After surgery it may be brought to your room.  For patients admitted to the hospital, discharge time will be determined by your treatment team.  Patients discharged the day of surgery will not be allowed to drive home.   Name and phone number of your driver:   family Special instructions:  None  Please read over the following fact sheets that you were given. Anesthesia Post-op Instructions and Care and Recovery After Surgery       Dilation and Curettage or Vacuum Curettage Dilation and curettage (D&C) and vacuum curettage are minor procedures. A D&C involves stretching (dilation) the cervix and scraping (curettage) the inside lining of the uterus (endometrium). During a D&C, tissue is gently scraped from the endometrium, starting from the top portion of the uterus down to the lowest part of the uterus (cervix). During a vacuum curettage, the lining and tissue in the uterus are removed with the use of gentle suction. Curettage may be performed to either diagnose or treat a problem. As a diagnostic procedure, curettage is performed to examine tissues from the uterus. A diagnostic curettage may be done if you have:  Irregular  bleeding in the uterus.  Bleeding with the development of clots.  Spotting between menstrual periods.  Prolonged menstrual periods or other abnormal bleeding.  Bleeding after menopause.  No menstrual period (amenorrhea).  A change in size and shape of the uterus.  Abnormal endometrial cells discovered during a Pap test.  As a treatment procedure, curettage may be performed for the following reasons:  Removal of an IUD (intrauterine device).  Removal of retained placenta after giving birth.  Abortion.  Miscarriage.  Removal of endometrial polyps.  Removal of uncommon types of noncancerous lumps (fibroids).  Tell a health care provider about:  Any allergies you have, including allergies to prescribed medicine or latex.  All medicines you are taking, including vitamins, herbs, eye drops, creams, and over-the-counter medicines. This is especially important if you take any blood-thinning medicine. Bring a list of all of your medicines to your appointment.  Any problems you or family members have had with anesthetic medicines.  Any blood disorders you have.  Any surgeries you have had.  Your medical history and any medical conditions you have.  Whether you are pregnant or may be pregnant.  Recent vaginal infections you have had.  Recent menstrual periods, bleeding problems you have had, and what form of birth control (contraception) you use. What are the risks? Generally, this is a safe procedure. However, problems may occur, including:  Infection.  Heavy vaginal  bleeding.  Allergic reactions to medicines.  Damage to the cervix or other structures or organs.  Development of scar tissue (adhesions) inside the uterus, which can cause abnormal amounts of menstrual bleeding. This may make it harder to get pregnant in the future.  A hole (perforation) or puncture in the uterine wall. This is rare.  What happens before the procedure? Staying hydrated Follow  instructions from your health care provider about hydration, which may include:  Up to 2 hours before the procedure - you may continue to drink clear liquids, such as water, clear fruit juice, black coffee, and plain tea.  Eating and drinking restrictions Follow instructions from your health care provider about eating and drinking, which may include:  8 hours before the procedure - stop eating heavy meals or foods such as meat, fried foods, or fatty foods.  6 hours before the procedure - stop eating light meals or foods, such as toast or cereal.  6 hours before the procedure - stop drinking milk or drinks that contain milk.  2 hours before the procedure - stop drinking clear liquids. If your health care provider told you to take your medicine(s) on the day of your procedure, take them with only a sip of water.  Medicines  Ask your health care provider about: ? Changing or stopping your regular medicines. This is especially important if you are taking diabetes medicines or blood thinners. ? Taking medicines such as aspirin and ibuprofen. These medicines can thin your blood. Do not take these medicines before your procedure if your health care provider instructs you not to.  You may be given antibiotic medicine to help prevent infection. General instructions  For 24 hours before your procedure, do not: ? Douche. ? Use tampons. ? Use medicines, creams, or suppositories in the vagina. ? Have sexual intercourse.  You may be given a pregnancy test on the day of the procedure.  Plan to have someone take you home from the hospital or clinic.  You may have a blood or urine sample taken.  If you will be going home right after the procedure, plan to have someone with you for 24 hours. What happens during the procedure?  To reduce your risk of infection: ? Your health care team will wash or sanitize their hands. ? Your skin will be washed with soap.  An IV tube will be inserted into  one of your veins.  You will be given one of the following: ? A medicine that numbs the area in and around the cervix (local anesthetic). ? A medicine to make you fall asleep (general anesthetic).  You will lie down on your back, with your feet in foot rests (stirrups).  The size and position of your uterus will be checked.  A lubricated instrument (speculum or Sims retractor) will be inserted into the back side of your vagina. The speculum will be used to hold apart the walls of your vagina so your health care provider can see your cervix.  A tool (tenaculum) will be attached to the lip of the cervix to stabilize it.  Your cervix will be softened and dilated. This may be done by: ? Taking a medicine. ? Having tapered dilators or thin rods (laminaria) or gradual widening instruments (tapered dilators) inserted into your cervix.  A small, sharp, curved instrument (curette) will be used to scrape a small amount of tissue or cells from the endometrium or cervical canal. In some cases, gentle suction is applied with the curette.  The curette will then be removed. The cells will be taken to a lab for testing. The procedure may vary among health care providers and hospitals. What happens after the procedure?  You may have mild cramping, backache, pain, and light bleeding or spotting. You may pass small blood clots from your vagina.  You may have to wear compression stockings. These stockings help to prevent blood clots and reduce swelling in your legs.  Your blood pressure, heart rate, breathing rate, and blood oxygen level will be monitored until the medicines you were given have worn off. Summary  Dilation and curettage (D&C) involves stretching (dilation) the cervix and scraping (curettage) the inside lining of the uterus (endometrium).  After the procedure, you may have mild cramping, backache, pain, and light bleeding or spotting. You may pass small blood clots from your vagina.  Plan  to have someone take you home from the hospital or clinic. This information is not intended to replace advice given to you by your health care provider. Make sure you discuss any questions you have with your health care provider. Document Released: 01/09/2005 Document Revised: 09/26/2015 Document Reviewed: 09/26/2015 Elsevier Interactive Patient Education  2018 [employer].  Dilation and Curettage or Vacuum Curettage, Care After These instructions give you information about caring for yourself after your procedure. Your doctor may also give you more specific instructions. Call your doctor if you have any problems or questions after your procedure. Follow these instructions at home: Activity  Do not drive or use heavy machinery while taking prescription pain medicine.  For 24 hours after your procedure, avoid driving.  Take short walks often, followed by rest periods. Ask your doctor what activities are safe for you. After one or two days, you may be able to return to your normal activities.  Do not lift anything that is heavier than 10 lb (4.5 kg) until your doctor approves.  For at least 2 weeks, or as long as told by your doctor: ? Do not douche. ? Do not use tampons. ? Do not have sex. General instructions  Take over-the-counter and prescription medicines only as told by your doctor. This is very important if you take blood thinning medicine.  Do not take baths, swim, or use a hot tub until your doctor approves. Take showers instead of baths.  Wear compression stockings as told by your doctor.  It is up to you to get the results of your procedure. Ask your doctor when your results will be ready.  Keep all follow-up visits as told by your doctor. This is important. Contact a doctor if:  You have very bad cramps that get worse or do not get better with medicine.  You have very bad pain in your belly (abdomen).  You cannot drink fluids without throwing up (vomiting).  You  get pain in a different part of the area between your belly and thighs (pelvis).  You have bad-smelling discharge from your vagina.  You have a rash. Get help right away if:  You are bleeding a lot from your vagina. A lot of bleeding means soaking more than one sanitary pad in an hour, for 2 hours in a row.  You have clumps of blood (blood clots) coming from your vagina.  You have a fever or chills.  Your belly feels very tender or hard.  You have chest pain.  You have trouble breathing.  You cough up blood.  You feel dizzy.  You feel light-headed.  You pass out (faint).  You have pain in your neck or shoulder area. Summary  Take short walks often, followed by rest periods. Ask your doctor what activities are safe for you. After one or two days, you may be able to return to your normal activities.  Do not lift anything that is heavier than 10 lb (4.5 kg) until your doctor approves.  Do not take baths, swim, or use a hot tub until your doctor approves. Take showers instead of baths.  Contact your doctor if you have any symptoms of infection, like bad-smelling discharge from your vagina. This information is not intended to replace advice given to you by your health care provider. Make sure you discuss any questions you have with your health care provider. Document Released: 10/19/2007 Document Revised: 09/27/2015 Document Reviewed: 09/27/2015 Elsevier Interactive Patient Education  2017 [place]. Hysteroscopy Hysteroscopy is a procedure used for looking inside the womb (uterus). It may be done for various reasons, including:  To evaluate abnormal bleeding, fibroid (benign, noncancerous) tumors, polyps, scar tissue (adhesions), and possibly cancer of the uterus.  To look for lumps (tumors) and other uterine growths.  To look for causes of why a woman cannot get pregnant (infertility), causes of recurrent loss of pregnancy (miscarriages), or a lost intrauterine device  (IUD).  To perform a sterilization by blocking the fallopian tubes from inside the uterus.  In this procedure, a thin, flexible tube with a tiny light and camera on the end of it (hysteroscope) is used to look inside the uterus. A hysteroscopy should be done right after a menstrual period to be sure you are not pregnant. LET [facility] CARE PROVIDER KNOW ABOUT:  Any allergies you have.  All medicines you are taking, including vitamins, herbs, eye drops, creams, and over-the-counter medicines.  Previous problems you or members of your family have had with the use of anesthetics.  Any blood disorders you have.  Previous surgeries you have had.  Medical conditions you have. RISKS AND COMPLICATIONS Generally, this is a safe procedure. However, as with any procedure, complications can occur. Possible complications include:  Putting a hole in the uterus.  Excessive bleeding.  Infection.  Damage to the cervix.  Injury to other organs.  Allergic reaction to medicines.  Too much fluid used in the uterus for the procedure.  BEFORE THE PROCEDURE  Ask your health care provider about changing or stopping any regular medicines.  Do not take aspirin or blood thinners for 1 week before the procedure, or as directed by your health care provider. These can cause bleeding.  If you smoke, do not smoke for 2 weeks before the procedure.  In some cases, a medicine is placed in the cervix the day before the procedure. This medicine makes the cervix have a larger opening (dilate). This makes it easier for the instrument to be inserted into the uterus during the procedure.  Do not eat or drink anything for at least 8 hours before the surgery.  Arrange for someone to take you home after the procedure. PROCEDURE  You may be given a medicine to relax you (sedative). You may also be given one of the following: ? A medicine that numbs the area around the cervix (local anesthetic). ? A medicine  that makes you sleep through the procedure (general anesthetic).  The hysteroscope is inserted through the vagina into the uterus. The camera on the hysteroscope sends a picture to a TV screen. This gives the surgeon a good view inside the uterus.  During  the procedure, air or a liquid is put into the uterus, which allows the surgeon to see better.  Sometimes, tissue is gently scraped from inside the uterus. These tissue samples are sent to a lab for testing. What to expect after the procedure  If you had a general anesthetic, you may be groggy for a couple hours after the procedure.  If you had a local anesthetic, you will be able to go home as soon as you are stable and feel ready.  You may have some cramping. This normally lasts for a couple days.  You may have bleeding, which varies from light spotting for a few days to menstrual-like bleeding for 3-7 days. This is normal.  If your test results are not back during the visit, make an appointment with your health care provider to find out the results. This information is not intended to replace advice given to you by your health care provider. Make sure you discuss any questions you have with your health care provider. Document Released: 04/17/2000 Document Revised: 06/17/2015 Document Reviewed: 08/08/2012 Elsevier Interactive Patient Education  2017 [place]. Hysteroscopy, Care After Refer to this sheet in the next few weeks. These instructions provide you with information on caring for yourself after your procedure. Your health care provider may also give you more specific instructions. Your treatment has been planned according to current medical practices, but problems sometimes occur. Call your health care provider if you have any problems or questions after your procedure. What can I expect after the procedure? After your procedure, it is typical to have the following:  You may have some cramping. This normally lasts for a  couple days.  You may have bleeding. This can vary from light spotting for a few days to menstrual-like bleeding for 3-7 days.  Follow these instructions at home:  Rest for the first 1-2 days after the procedure.  Only take over-the-counter or prescription medicines as directed by your health care provider. Do not take aspirin. It can increase the chances of bleeding.  Take showers instead of baths for 2 weeks or as directed by your health care provider.  Do not drive for 24 hours or as directed.  Do not drink alcohol while taking pain medicine.  Do not use tampons, douche, or have sexual intercourse for 2 weeks or until your health care provider says it is okay.  Take your temperature twice a day for 4-5 days. Write it down each time.  Follow your health care provider's advice about diet, exercise, and lifting.  If you develop constipation, you may: ? Take a mild laxative if your health care provider approves. ? Add bran foods to your diet. ? Drink enough fluids to keep your urine clear or pale yellow.  Try to have someone with you or available to you for the first 24-48 hours, especially if you were given a general anesthetic.  Follow up with your health care provider as directed. Contact a health care provider if:  You feel dizzy or lightheaded.  You feel sick to your stomach (nauseous).  You have abnormal vaginal discharge.  You have a rash.  You have pain that is not controlled with medicine. Get help right away if:  You have bleeding that is heavier than a normal menstrual period.  You have a fever.  You have increasing cramps or pain, not controlled with medicine.  You have new belly (abdominal) pain.  You pass out.  You have pain in the tops of your  shoulders (shoulder strap areas).  You have shortness of breath. This information is not intended to replace advice given to you by your health care provider. Make sure you discuss any questions you have  with your health care provider. Document Released: 10/30/2012 Document Revised: 06/17/2015 Document Reviewed: 08/08/2012 Elsevier Interactive Patient Education  2017 [place].  Endometrial Ablation Endometrial ablation is a procedure that destroys the thin inner layer of the lining of the uterus (endometrium). This procedure may be done:  To stop heavy periods.  To stop bleeding that is causing anemia.  To control irregular bleeding.  To treat bleeding caused by small tumors (fibroids) in the endometrium.  This procedure is often an alternative to major surgery, such as removal of the uterus and cervix (hysterectomy). As a result of this procedure:  You may not be able to have children. However, if you are premenopausal (you have not gone through menopause): ? You may still have a small chance of getting pregnant. ? You will need to use a reliable method of birth control after the procedure to prevent pregnancy.  You may stop having a menstrual period, or you may have only a small amount of bleeding during your period. Menstruation may return several years after the procedure.  Tell a health care provider about:  Any allergies you have.  All medicines you are taking, including vitamins, herbs, eye drops, creams, and over-the-counter medicines.  Any problems you or family members have had with the use of anesthetic medicines.  Any blood disorders you have.  Any surgeries you have had.  Any medical conditions you have. What are the risks? Generally, this is a safe procedure. However, problems may occur, including:  A hole (perforation) in the uterus or bowel.  Infection of the uterus, bladder, or vagina.  Bleeding.  Damage to other structures or organs.  An air bubble in the lung (air embolus).  Problems with pregnancy after the procedure.  Failure of the procedure.  Decreased ability to diagnose cancer in the endometrium.  What happens before the  procedure?  You will have tests of your endometrium to make sure there are no pre-cancerous cells or cancer cells present.  You may have an ultrasound of the uterus.  You may be given medicines to thin the endometrium.  Ask your health care provider about: ? Changing or stopping your regular medicines. This is especially important if you take diabetes medicines or blood thinners. ? Taking medicines such as aspirin and ibuprofen. These medicines can thin your blood. Do not take these medicines before your procedure if your doctor tells you not to.  Plan to have someone take you home from the hospital or clinic. What happens during the procedure?  You will lie on an exam table with your feet and legs supported as in a pelvic exam.  To lower your risk of infection: ? Your health care team will wash or sanitize their hands and put on germ-free (sterile) gloves. ? Your genital area will be washed with soap.  An IV tube will be inserted into one of your veins.  You will be given a medicine to help you relax (sedative).  A surgical instrument with a light and camera (resectoscope) will be inserted into your vagina and moved into your uterus. This allows your surgeon to see inside your uterus.  Endometrial tissue will be removed using one of the following methods: ? Radiofrequency. This method uses a radiofrequency-alternating electric current to remove the endometrium. ? Cryotherapy. This  method uses extreme cold to freeze the endometrium. ? Heated-free liquid. This method uses a heated saltwater (saline) solution to remove the endometrium. ? Microwave. This method uses high-energy microwaves to heat up the endometrium and remove it. ? Thermal balloon. This method involves inserting a catheter with a balloon tip into the uterus. The balloon tip is filled with heated fluid to remove the endometrium. The procedure may vary among health care providers and hospitals. What happens after the  procedure?  Your blood pressure, heart rate, breathing rate, and blood oxygen level will be monitored until the medicines you were given have worn off.  As tissue healing occurs, you may notice vaginal bleeding for 4-6 weeks after the procedure. You may also experience: ? Cramps. ? Thin, watery vaginal discharge that is light pink or brown in color. ? A need to urinate more frequently than usual. ? Nausea.  Do not drive for 24 hours if you were given a sedative.  Do not have sex or insert anything into your vagina until your health care provider approves. Summary  Endometrial ablation is done to treat the many causes of heavy menstrual bleeding.  The procedure may be done only after medications have been tried to control the bleeding.  Plan to have someone take you home from the hospital or clinic. This information is not intended to replace advice given to you by your health care provider. Make sure you discuss any questions you have with your health care provider. Document Released: 11/19/2003 Document Revised: 01/27/2016 Document Reviewed: 01/27/2016 Elsevier Interactive Patient Education  2017 [place] Anesthesia, Adult General anesthesia is the use of medicines to make a person "go to sleep" (be unconscious) for a medical procedure. General anesthesia is often recommended when a procedure:  Is long.  Requires you to be still or in an unusual position.  Is major and can cause you to lose blood.  Is impossible to do without general anesthesia.  The medicines used for general anesthesia are called general anesthetics. In addition to making you sleep, the medicines:  Prevent pain.  Control your blood pressure.  Relax your muscles.  Tell a health care provider about:  Any allergies you have.  All medicines you are taking, including vitamins, herbs, eye drops, creams, and over-the-counter medicines.  Any problems you or family members have had with  anesthetic medicines.  Types of anesthetics you have had in the past.  Any bleeding disorders you have.  Any surgeries you have had.  Any medical conditions you have.  Any history of heart or lung conditions, such as heart failure, sleep apnea, or chronic obstructive pulmonary disease (COPD).  Whether you are pregnant or may be pregnant.  Whether you use tobacco, alcohol, marijuana, or street drugs.  Any history of Armed forces logistics/support/administrative officer.  Any history of depression or anxiety. What are the risks? Generally, this is a safe procedure. However, problems may occur, including:  Allergic reaction to anesthetics.  Lung and heart problems.  Inhaling food or liquids from your stomach into your lungs (aspiration).  Injury to nerves.  Waking up during your procedure and being unable to move (rare).  Extreme agitation or a state of mental confusion (delirium) when you wake up from the anesthetic.  Air in the bloodstream, which can lead to stroke.  These problems are more likely to develop if you are having a major surgery or if you have an advanced medical condition. You can prevent some of these complications by answering all  of your health care provider's questions thoroughly and by following all pre-procedure instructions. General anesthesia can cause side effects, including:  Nausea or vomiting  A sore throat from the breathing tube.  Feeling cold or shivery.  Feeling tired, washed out, or achy.  Sleepiness or drowsiness.  Confusion or agitation.  What happens before the procedure? Staying hydrated Follow instructions from your health care provider about hydration, which may include:  Up to 2 hours before the procedure - you may continue to drink clear liquids, such as water, clear fruit juice, black coffee, and plain tea.  Eating and drinking restrictions Follow instructions from your health care provider about eating and drinking, which may include:  8 hours before the  procedure - stop eating heavy meals or foods such as meat, fried foods, or fatty foods.  6 hours before the procedure - stop eating light meals or foods, such as toast or cereal.  6 hours before the procedure - stop drinking milk or drinks that contain milk.  2 hours before the procedure - stop drinking clear liquids.  Medicines  Ask your health care provider about: ? Changing or stopping your regular medicines. This is especially important if you are taking diabetes medicines or blood thinners. ? Taking medicines such as aspirin and ibuprofen. These medicines can thin your blood. Do not take these medicines before your procedure if your health care provider instructs you not to. ? Taking new dietary supplements or medicines. Do not take these during the week before your procedure unless your health care provider approves them.  If you are told to take a medicine or to continue taking a medicine on the day of the procedure, take the medicine with sips of water. General instructions   Ask if you will be going home the same day, the following day, or after a longer hospital stay. ? Plan to have someone take you home. ? Plan to have someone stay with you for the first 24 hours after you leave the hospital or clinic.  For 3-6 weeks before the procedure, try not to use any tobacco products, such as cigarettes, chewing tobacco, and e-cigarettes.  You may brush your teeth on the morning of the procedure, but make sure to spit out the toothpaste. What happens during the procedure?  You will be given anesthetics through a mask and through an IV tube in one of your veins.  You may receive medicine to help you relax (sedative).  As soon as you are asleep, a breathing tube may be used to help you breathe.  An anesthesia specialist will stay with you throughout the procedure. He or she will help keep you comfortable and safe by continuing to give you medicines and adjusting the amount of  medicine that you get. He or she will also watch your blood pressure, pulse, and oxygen levels to make sure that the anesthetics do not cause any problems.  If a breathing tube was used to help you breathe, it will be removed before you wake up. The procedure may vary among health care providers and hospitals. What happens after the procedure?  You will wake up, often slowly, after the procedure is complete, usually in a recovery area.  Your blood pressure, heart rate, breathing rate, and blood oxygen level will be monitored until the medicines you were given have worn off.  You may be given medicine to help you calm down if you feel anxious or agitated.  If you will be going home the  same day, your health care provider may check to make sure you can stand, drink, and urinate.  Your health care providers will treat your pain and side effects before you go home.  Do not drive for 24 hours if you received a sedative.  You may: ? Feel nauseous and vomit. ? Have a sore throat. ? Have mental slowness. ? Feel cold or shivery. ? Feel sleepy. ? Feel tired. ? Feel sore or achy, even in parts of your body where you did not have surgery. This information is not intended to replace advice given to you by your health care provider. Make sure you discuss any questions you have with your health care provider. Document Released: 04/18/2007 Document Revised: 06/22/2015 Document Reviewed: 12/24/2014 Elsevier Interactive Patient Education  2018 [place] Anesthesia, Adult, Care After These instructions provide you with information about caring for yourself after your procedure. Your health care provider may also give you more specific instructions. Your treatment has been planned according to current medical practices, but problems sometimes occur. Call your health care provider if you have any problems or questions after your procedure. What can I expect after the procedure? After the  procedure, it is common to have:  Vomiting.  A sore throat.  Mental slowness.  It is common to feel:  Nauseous.  Cold or shivery.  Sleepy.  Tired.  Sore or achy, even in parts of your body where you did not have surgery.  Follow these instructions at home: For at least 24 hours after the procedure:  Do not: ? Participate in activities where you could fall or become injured. ? Drive. ? Use heavy machinery. ? Drink alcohol. ? Take sleeping pills or medicines that cause drowsiness. ? Make important decisions or sign legal documents. ? Take care of children on your own.  Rest. Eating and drinking  If you vomit, drink water, juice, or soup when you can drink without vomiting.  Drink enough fluid to keep your urine clear or pale yellow.  Make sure you have little or no nausea before eating solid foods.  Follow the diet recommended by your health care provider. General instructions  Have a responsible adult stay with you until you are awake and alert.  Return to your normal activities as told by your health care provider. Ask your health care provider what activities are safe for you.  Take over-the-counter and prescription medicines only as told by your health care provider.  If you smoke, do not smoke without supervision.  Keep all follow-up visits as told by your health care provider. This is important. Contact a health care provider if:  You continue to have nausea or vomiting at home, and medicines are not helpful.  You cannot drink fluids or start eating again.  You cannot urinate after 8-12 hours.  You develop a skin rash.  You have fever.  You have increasing redness at the site of your procedure. Get help right away if:  You have difficulty breathing.  You have chest pain.  You have unexpected bleeding.  You feel that you are having a life-threatening or urgent problem. This information is not intended to replace advice given to you by your  health care provider. Make sure you discuss any questions you have with your health care provider. Document Released: 04/17/2000 Document Revised: 06/14/2015 Document Reviewed: 12/24/2014 Elsevier Interactive Patient Education  [employer].

## 2017-05-14 ENCOUNTER — Other Ambulatory Visit: Payer: Self-pay

## 2017-05-14 ENCOUNTER — Encounter (HOSPITAL_COMMUNITY)
Admission: RE | Admit: 2017-05-14 | Discharge: 2017-05-14 | Disposition: A | Discharge status: Home / Self Care | Payer: Commercial Managed Care - PPO | Source: Ambulatory Visit | Attending: Obstetrics & Gynecology | Admitting: Obstetrics & Gynecology

## 2017-05-14 ENCOUNTER — Encounter (HOSPITAL_COMMUNITY): Payer: Self-pay

## 2017-05-14 DIAGNOSIS — Z8249 Family history of ischemic heart disease and other diseases of the circulatory system: Secondary | ICD-10-CM | POA: Diagnosis not present

## 2017-05-14 DIAGNOSIS — Z823 Family history of stroke: Secondary | ICD-10-CM | POA: Diagnosis not present

## 2017-05-14 DIAGNOSIS — N946 Dysmenorrhea, unspecified: Secondary | ICD-10-CM | POA: Diagnosis present

## 2017-05-14 DIAGNOSIS — N858 Other specified noninflammatory disorders of uterus: Secondary | ICD-10-CM | POA: Diagnosis not present

## 2017-05-14 DIAGNOSIS — N921 Excessive and frequent menstruation with irregular cycle: Secondary | ICD-10-CM | POA: Diagnosis present

## 2017-05-14 DIAGNOSIS — Z87891 Personal history of nicotine dependence: Secondary | ICD-10-CM | POA: Diagnosis not present

## 2017-05-14 LAB — COMPREHENSIVE METABOLIC PANEL
ALT: 15 U/L (ref 14–54)
AST: 15 U/L (ref 15–41)
Albumin: 4.5 g/dL (ref 3.5–5.0)
Alkaline Phosphatase: 53 U/L (ref 38–126)
Anion gap: 9 (ref 5–15)
BILIRUBIN TOTAL: 0.3 mg/dL (ref 0.3–1.2)
BUN: 12 mg/dL (ref 6–20)
CO2: 22 mmol/L (ref 22–32)
Calcium: 9.1 mg/dL (ref 8.9–10.3)
Chloride: 106 mmol/L (ref 101–111)
Creatinine, Ser: 0.62 mg/dL (ref 0.44–1.00)
GFR calc Af Amer: >60 mL/min (ref >60)
Glucose, Bld: 104 mg/dL — ABNORMAL HIGH (ref 65–99)
POTASSIUM: 3.6 mmol/L (ref 3.5–5.1)
Sodium: 137 mmol/L (ref 135–145)
TOTAL PROTEIN: 7.8 g/dL (ref 6.5–8.1)

## 2017-05-14 LAB — CBC
HEMATOCRIT: 41.2 % (ref 36.0–46.0)
Hemoglobin: 13.4 g/dL (ref 12.0–15.0)
MCH: 28.1 pg (ref 26.0–34.0)
MCHC: 32.5 g/dL (ref 30.0–36.0)
MCV: 86.4 fL (ref 78.0–100.0)
PLATELETS: 277 10*3/uL (ref 150–400)
RBC: 4.77 MIL/uL (ref 3.87–5.11)
RDW: 13.4 % (ref 11.5–15.5)
WBC: 6.9 10*3/uL (ref 4.0–10.5)

## 2017-05-14 LAB — HCG, QUANTITATIVE, PREGNANCY: hCG, Beta Chain, Quant, S: <1 m[IU]/mL (ref <5)

## 2017-05-14 LAB — RAPID HIV SCREEN (HIV 1/2 AB+AG)
HIV 1/2 Antibodies: NONREACTIVE
HIV-1 P24 Antigen - HIV24: NONREACTIVE

## 2017-05-16 ENCOUNTER — Ambulatory Visit (HOSPITAL_COMMUNITY)
Admission: RE | Admit: 2017-05-16 | Discharge: 2017-05-16 | Disposition: A | Discharge status: Home / Self Care | Payer: Commercial Managed Care - PPO | Source: Ambulatory Visit | Attending: Obstetrics & Gynecology | Admitting: Obstetrics & Gynecology

## 2017-05-16 ENCOUNTER — Ambulatory Visit (HOSPITAL_COMMUNITY): Payer: Commercial Managed Care - PPO | Admitting: Anesthesiology

## 2017-05-16 ENCOUNTER — Encounter (HOSPITAL_COMMUNITY): Payer: Self-pay | Admitting: Anesthesiology

## 2017-05-16 ENCOUNTER — Encounter (HOSPITAL_COMMUNITY)
Admission: RE | Disposition: A | Discharge status: Home / Self Care | Payer: Self-pay | Source: Ambulatory Visit | Attending: Obstetrics & Gynecology

## 2017-05-16 DIAGNOSIS — N946 Dysmenorrhea, unspecified: Secondary | ICD-10-CM | POA: Insufficient documentation

## 2017-05-16 DIAGNOSIS — N921 Excessive and frequent menstruation with irregular cycle: Secondary | ICD-10-CM | POA: Insufficient documentation

## 2017-05-16 DIAGNOSIS — Z823 Family history of stroke: Secondary | ICD-10-CM | POA: Insufficient documentation

## 2017-05-16 DIAGNOSIS — Z87891 Personal history of nicotine dependence: Secondary | ICD-10-CM | POA: Insufficient documentation

## 2017-05-16 DIAGNOSIS — N858 Other specified noninflammatory disorders of uterus: Secondary | ICD-10-CM | POA: Diagnosis not present

## 2017-05-16 DIAGNOSIS — Z8249 Family history of ischemic heart disease and other diseases of the circulatory system: Secondary | ICD-10-CM | POA: Insufficient documentation

## 2017-05-16 HISTORY — PX: HYSTEROSCOPY WITH D & C: SHX1775

## 2017-05-16 HISTORY — PX: ENDOMETRIAL ABLATION: SHX621

## 2017-05-16 LAB — URINALYSIS, MICROSCOPIC (REFLEX): WBC, UA: >50 WBC/hpf (ref 0–5)

## 2017-05-16 LAB — URINALYSIS, ROUTINE W REFLEX MICROSCOPIC
BILIRUBIN URINE: NEGATIVE
Glucose, UA: NEGATIVE mg/dL
Ketones, ur: NEGATIVE mg/dL
NITRITE: NEGATIVE
Protein, ur: NEGATIVE mg/dL
Specific Gravity, Urine: 1.02 (ref 1.005–1.030)
pH: 7 (ref 5.0–8.0)

## 2017-05-16 SURGERY — DILATATION AND CURETTAGE /HYSTEROSCOPY
Anesthesia: General

## 2017-05-16 MED ORDER — MEPERIDINE HCL 50 MG/ML IJ SOLN: 6.2500 mg | INTRAMUSCULAR | Status: DC | PRN

## 2017-05-16 MED ORDER — LIDOCAINE HCL 1 % IJ SOLN
INTRAMUSCULAR | Status: DC | PRN
  Administered 2017-05-16: 25 mg via INTRADERMAL

## 2017-05-16 MED ORDER — PROPOFOL 10 MG/ML IV BOLUS
INTRAVENOUS | Status: DC | PRN
  Administered 2017-05-16: 150 mg via INTRAVENOUS

## 2017-05-16 MED ORDER — MIDAZOLAM HCL 5 MG/5ML IJ SOLN
INTRAMUSCULAR | Status: DC | PRN
  Administered 2017-05-16: 2 mg via INTRAVENOUS

## 2017-05-16 MED ORDER — KETOROLAC TROMETHAMINE 30 MG/ML IJ SOLN
30.0000 mg | Freq: Once | INTRAMUSCULAR | Status: AC | PRN
  Administered 2017-05-16: 30 mg via INTRAVENOUS
  Filled 2017-05-16: qty 1

## 2017-05-16 MED ORDER — HYDROCODONE-ACETAMINOPHEN 5-325 MG PO TABS: 1.0000 | ORAL_TABLET | Freq: Four times a day (QID) | ORAL | 0 refills | Status: DC | PRN

## 2017-05-16 MED ORDER — KETOROLAC TROMETHAMINE 30 MG/ML IJ SOLN
30.0000 mg | Freq: Once | INTRAMUSCULAR | Status: AC
Start: 2017-05-16 — End: 2017-05-16
  Administered 2017-05-16: 30 mg via INTRAVENOUS
  Filled 2017-05-16: qty 1

## 2017-05-16 MED ORDER — SODIUM CHLORIDE 0.9 % IR SOLN
Status: DC | PRN
  Administered 2017-05-16: 1000 mL

## 2017-05-16 MED ORDER — KETOROLAC TROMETHAMINE 10 MG PO TABS: 10.0000 mg | ORAL_TABLET | Freq: Three times a day (TID) | ORAL | 0 refills | Status: DC | PRN

## 2017-05-16 MED ORDER — ONDANSETRON HCL 4 MG/2ML IJ SOLN
INTRAMUSCULAR | Status: DC | PRN
  Administered 2017-05-16: 4 mg via INTRAVENOUS

## 2017-05-16 MED ORDER — ONDANSETRON HCL 4 MG/2ML IJ SOLN: 4.0000 mg | Freq: Once | INTRAMUSCULAR | Status: DC | PRN

## 2017-05-16 MED ORDER — CEFAZOLIN SODIUM-DEXTROSE 2-4 GM/100ML-% IV SOLN
2.0000 g | INTRAVENOUS | Status: AC
  Administered 2017-05-16: 2 g via INTRAVENOUS
  Filled 2017-05-16: qty 100

## 2017-05-16 MED ORDER — HYDROMORPHONE HCL 1 MG/ML IJ SOLN: 0.2500 mg | INTRAMUSCULAR | Status: DC | PRN

## 2017-05-16 MED ORDER — LACTATED RINGERS IV SOLN
INTRAVENOUS | Status: DC
  Administered 2017-05-16: 11:00:00 via INTRAVENOUS

## 2017-05-16 MED ORDER — HYDROCODONE-ACETAMINOPHEN 7.5-325 MG PO TABS: 1.0000 | ORAL_TABLET | Freq: Once | ORAL | Status: DC | PRN

## 2017-05-16 MED ORDER — ONDANSETRON 8 MG PO TBDP: 8.0000 mg | ORAL_TABLET | Freq: Three times a day (TID) | ORAL | 0 refills | Status: DC | PRN

## 2017-05-16 SURGICAL SUPPLY — 28 items
BAG HAMPER (MISCELLANEOUS) ×3 IMPLANT
CLOTH BEACON ORANGE TIMEOUT ST (SAFETY) ×3 IMPLANT
COVER LIGHT HANDLE STERIS (MISCELLANEOUS) ×6 IMPLANT
GAUZE SPONGE 4X4 12PLY STRL (GAUZE/BANDAGES/DRESSINGS) ×3 IMPLANT
GAUZE SPONGE 4X4 16PLY XRAY LF (GAUZE/BANDAGES/DRESSINGS) ×3 IMPLANT
GLOVE BIOGEL PI IND STRL 7.0 (GLOVE) ×1 IMPLANT
GLOVE BIOGEL PI IND STRL 8 (GLOVE) ×1 IMPLANT
GLOVE BIOGEL PI INDICATOR 7.0 (GLOVE) ×4
GLOVE BIOGEL PI INDICATOR 8 (GLOVE) ×2
GLOVE ECLIPSE 6.5 STRL STRAW (GLOVE) ×2 IMPLANT
GLOVE ECLIPSE 8.0 STRL XLNG CF (GLOVE) ×3 IMPLANT
GOWN STRL REUS W/TWL LRG LVL3 (GOWN DISPOSABLE) ×3 IMPLANT
GOWN STRL REUS W/TWL XL LVL3 (GOWN DISPOSABLE) ×3 IMPLANT
HANDPIECE ABLA MINERVA ENDO (MISCELLANEOUS) ×2 IMPLANT
INST SET HYSTEROSCOPY (KITS) ×3 IMPLANT
IV NS 1000ML (IV SOLUTION) ×3
IV NS 1000ML BAXH (IV SOLUTION) ×1 IMPLANT
KIT ROOM TURNOVER AP CYSTO (KITS) ×3 IMPLANT
MANIFOLD NEPTUNE II (INSTRUMENTS) ×3 IMPLANT
MARKER SKIN DUAL TIP RULER LAB (MISCELLANEOUS) ×3 IMPLANT
NS IRRIG 1000ML POUR BTL (IV SOLUTION) ×3 IMPLANT
PACK BASIC III (CUSTOM PROCEDURE TRAY) ×3
PACK SRG BSC III STRL LF ECLPS (CUSTOM PROCEDURE TRAY) ×1 IMPLANT
PAD ARMBOARD 7.5X6 YLW CONV (MISCELLANEOUS) ×3 IMPLANT
PAD TELFA 3X4 1S STER (GAUZE/BANDAGES/DRESSINGS) ×3 IMPLANT
SET IRRIG Y TYPE TUR BLADDER L (SET/KITS/TRAYS/PACK) ×3 IMPLANT
SHEET LAVH (DRAPES) ×3 IMPLANT
YANKAUER SUCT BULB TIP 10FT TU (MISCELLANEOUS) ×3 IMPLANT

## 2017-05-16 NOTE — Transfer of Care (Signed)
Immediate Anesthesia Transfer of Care Note  Patient: Ilsa IhaJennifer I Lince  Procedure(s) Performed: DILATATION AND CURETTAGE /HYSTEROSCOPY (N/A ) ENDOMETRIAL ABLATION WITH MINERVA (N/A )  Patient Location: PACU  Anesthesia Type:General  Level of Consciousness: awake, alert  and patient cooperative  Airway & Oxygen Therapy: Patient Spontanous Breathing and Patient connected to nasal cannula oxygen  Post-op Assessment: Report given to RN, Post -op Vital signs reviewed and stable and Patient moving all extremities  Post vital signs: Reviewed and stable  Last Vitals:  Vitals Value Taken Time  BP    Temp    Pulse    Resp    SpO2      Last Pain:  Vitals:   05/16/17 1052  TempSrc: Oral  PainSc: 0-No pain      Patients Stated Pain Goal: 5 (05/16/17 1052)  Complications: No apparent anesthesia complications

## 2017-05-16 NOTE — Anesthesia Procedure Notes (Signed)
Procedure Name: LMA Insertion Date/Time: 05/16/2017 12:08 PM Performed by: Despina HiddenIdacavage, Nakoa Ganus J, CRNA Pre-anesthesia Checklist: Patient identified, Suction available, Emergency Drugs available and Patient being monitored Patient Re-evaluated:Patient Re-evaluated prior to induction Oxygen Delivery Method: Circle System Utilized Preoxygenation: Pre-oxygenation with 100% oxygen Induction Type: IV induction Ventilation: Mask ventilation without difficulty LMA: LMA inserted LMA Size: 4.0 Number of attempts: 1 Placement Confirmation: positive ETCO2 and breath sounds checked- equal and bilateral Tube secured with: Tape Dental Injury: Teeth and Oropharynx as per pre-operative assessment

## 2017-05-16 NOTE — Anesthesia Preprocedure Evaluation (Signed)
Anesthesia Evaluation  Patient identified by MRN, date of birth, ID band Patient awake    Reviewed: Allergy & Precautions, H&P , NPO status , Patient's Chart, lab work & pertinent test results  Airway Mallampati: II  TM Distance: >3 FB Neck ROM: full    Dental no notable dental hx.    Pulmonary neg pulmonary ROS, former smoker,    Pulmonary exam normal breath sounds clear to auscultation       Cardiovascular Exercise Tolerance: Good negative cardio ROS   Rhythm:regular Rate:Normal     Neuro/Psych negative neurological ROS  negative psych ROS   GI/Hepatic negative GI ROS, Neg liver ROS,   Endo/Other  negative endocrine ROS  Renal/GU negative Renal ROS  negative genitourinary   Musculoskeletal   Abdominal   Peds  Hematology negative hematology ROS (+)   Anesthesia Other Findings   Reproductive/Obstetrics negative OB ROS                             Anesthesia Physical Anesthesia Plan  ASA: II  Anesthesia Plan: General   Post-op Pain Management:    Induction:   PONV Risk Score and Plan: Ondansetron  Airway Management Planned:   Additional Equipment:   Intra-op Plan:   Post-operative Plan:   Informed Consent: I have reviewed the patients History and Physical, chart, labs and discussed the procedure including the risks, benefits and alternatives for the proposed anesthesia with the patient or authorized representative who has indicated his/her understanding and acceptance.   Dental Advisory Given  Plan Discussed with: CRNA  Anesthesia Plan Comments:         Anesthesia Quick Evaluation

## 2017-05-16 NOTE — Op Note (Signed)
Preoperative diagnosis:  1.   menometrorrhagia                                         2.  dysmenorrhea   Postoperative diagnoses: Same as above   Procedure: Hysteroscopy, uterine curettage, endometrial ablation using Minerva  Surgeon: Lazaro ArmsLuther H Tico Crotteau   Anesthesia: Laryngeal mask airway  Findings: The endometrium was normal. There were no fibroid or other abnormalities.  Description of operation: The patient was taken to the operating room and placed in the supine position. She underwent general anesthesia using the laryngeal mask airway. She was placed in the dorsal lithotomy position and prepped and draped in the usual sterile fashion. A Graves speculum was placed and the anterior cervical lip was grasped with a single-tooth tenaculum. The cervix was dilated serially to allow passage of the hysteroscope. Diagnostic hysteroscopy was performed and was found to be normal. A vigorous uterine curettage was then performed and all tissue sent to pathology for evaluation.  I then proceeded to perform the Minerva endometrial ablation.   The uterus sounded to 8.5 cm The handpiece was attached to the Minerva power source/machine and the handpiece passed the checklist. The array was squeezed down to remove all of the air present.  The array was then place into the endometrial cavity and deployed to a length of 5.5 cm. The handpiece confirmed appropriate width by being in the green portion of the visual dial. The cervical cuff was then inflated to the point the CO2 indicator was in the green. The endometrial integrity check was then performed and integrity sequence was confirmed x 2. The heating was then begun and carried out for a total of 2 minutes(which is standard therapy time). When the plasma cycle was finished,  the cervical cuff was deflated and the array was removed with tissue present on the silicon membrane. There was appropriate post Minerva bleeding and uterine discharge.     All of the  equipment worked well throughout the procedure.  The patient was awakened from anesthesia and taken to the recovery room in good stable condition all counts were correct. She received 2 g of Ancef and 30 mg of Toradol preoperatively. She will be discharged from the recovery room and followed up in the office in 1- 2 weeks.   She can expect 4 weeks of post procedure bloody watery discharge  Lazaro ArmsLuther H Li Fragoso, MD   05/16/2017 12:59 PM

## 2017-05-16 NOTE — H&P (Signed)
Preoperative History and Physical  Rachel Carrillo is a 37 y.o. [phone] with Patient's last menstrual period was 04/18/2017. admitted for a hysteroscopy uterine curettage endometrial ablation.  Rachel Carrillo is a 37 y.o. Caucasian female presenting today for passing clots on Friday  States that one was about 7-8 inches long (?decidual cast?). . Was seen in October, c/o menorrhagia at that time. Has had BTL, offered and accepted trial of Lysteda. Stated that it shortened periods to about 8-=9 days, and lightened it some, but doesn't sound like enough.  Has already tried COCs.  Interested in endo ablation at this point.     PMH:    Past Medical History:  Diagnosis Date  . HSV-2 infection     PSH:     Past Surgical History:  Procedure Laterality Date  . TUBAL LIGATION  02/18/2011   Procedure: POST PARTUM TUBAL LIGATION;  Surgeon: [name], MD;  Location: [facility];  Service: Gynecology;  Laterality: Bilateral;  Post partum tubal ligation bilateral with filshie clips.  . WISDOM TOOTH EXTRACTION      POb/GynH:      OB History    Gravida  6   Para  3   Term  3   Preterm      AB  3   Living  3     SAB  3   TAB      Ectopic      Multiple      Live Births  1           SH:   Social History   Tobacco Use  . Smoking status: Former Smoker    Packs/day: 0.25    Types: Cigarettes  . Smokeless tobacco: Never Used  Substance Use Topics  . Alcohol use: No  . Drug use: No    FH:    Family History  Problem Relation Age of Onset  . Hypertension Mother   . Other Mother        TIA  . Stroke Mother   . Irritable bowel syndrome Mother   . Diabetes Maternal Aunt   . Arthritis Maternal Aunt        rheumatoid  . Arthritis Maternal Uncle        rheumatoid  . Arthritis Maternal Grandmother        rheumatoid  . Crohn's disease Maternal Grandfather   . Cancer Maternal Grandfather   . Heart disease Paternal Grandfather   . Other Paternal Grandfather    was on oxygen     Allergies: No Known Allergies  Medications:       Current Facility-Administered Medications:  .  ceFAZolin (ANCEF) IVPB 2g/100 mL premix, 2 g, Intravenous, On Call to OR, [name], MD .  lactated ringers infusion, , Intravenous, Continuous, Shona Needles, MD, Last Rate: 50 mL/hr at 05/16/17 1105  Review of Systems:   Review of Systems  Constitutional: Negative for fever, chills, weight loss, malaise/fatigue and diaphoresis.  HENT: Negative for hearing loss, ear pain, nosebleeds, congestion, sore throat, neck pain, tinnitus and ear discharge.   Eyes: Negative for blurred vision, double vision, photophobia, pain, discharge and redness.  Respiratory: Negative for cough, hemoptysis, sputum production, shortness of breath, wheezing and stridor.   Cardiovascular: Negative for chest pain, palpitations, orthopnea, claudication, leg swelling and PND.  Gastrointestinal: Positive for abdominal pain. Negative for heartburn, nausea, vomiting, diarrhea, constipation, blood in stool and melena.  Genitourinary: Negative for dysuria, urgency, frequency, hematuria and flank  pain.  Musculoskeletal: Negative for myalgias, back pain, joint pain and falls.  Skin: Negative for itching and rash.  Neurological: Negative for dizziness, tingling, tremors, sensory change, speech change, focal weakness, seizures, loss of consciousness, weakness and headaches.  Endo/Heme/Allergies: Negative for environmental allergies and polydipsia. Does not bruise/bleed easily.  Psychiatric/Behavioral: Negative for depression, suicidal ideas, hallucinations, memory loss and substance abuse. The patient is not nervous/anxious and does not have insomnia.      PHYSICAL EXAM:  Blood pressure 122/82, pulse 79, temperature 98.9 F (37.2 C), temperature source Oral, resp. rate 16, last menstrual period 04/18/2017, SpO2 97 %.    Vitals reviewed. Constitutional: She is oriented to person, place, and time. She  appears well-developed and well-nourished.  HENT:  Head: Normocephalic and atraumatic.  Right Ear: External ear normal.  Left Ear: External ear normal.  Nose: Nose normal.  Mouth/Throat: Oropharynx is clear and moist.  Eyes: Conjunctivae and EOM are normal. Pupils are equal, round, and reactive to light. Right eye exhibits no discharge. Left eye exhibits no discharge. No scleral icterus.  Neck: Normal range of motion. Neck supple. No tracheal deviation present. No thyromegaly present.  Cardiovascular: Normal rate, regular rhythm, normal heart sounds and intact distal pulses.  Exam reveals no gallop and no friction rub.   No murmur heard. Respiratory: Effort normal and breath sounds normal. No respiratory distress. She has no wheezes. She has no rales. She exhibits no tenderness.  GI: Soft. Bowel sounds are normal. She exhibits no distension and no mass. There is tenderness. There is no rebound and no guarding.  Genitourinary:       Vulva is normal without lesions Vagina is pink moist without discharge Cervix normal in appearance and pap is normal Uterus is normal size, contour, position, consistency, mobility, non-tender Adnexa is negative with normal sized ovaries by sonogram  Musculoskeletal: Normal range of motion. She exhibits no edema and no tenderness.  Neurological: She is alert and oriented to person, place, and time. She has normal reflexes. She displays normal reflexes. No cranial nerve deficit. She exhibits normal muscle tone. Coordination normal.  Skin: Skin is warm and dry. No rash noted. No erythema. No pallor.  Psychiatric: She has a normal mood and affect. Her behavior is normal. Judgment and thought content normal.    Labs: Results for orders placed or performed during the hospital encounter of 05/16/17 (from the past 336 hour(s))  Urinalysis, Routine w reflex microscopic   Collection Time: 05/16/17 10:45 AM  Result Value Ref Range   Color, Urine YELLOW YELLOW    APPearance CLEAR CLEAR   Specific Gravity, Urine 1.020 1.005 - 1.030   pH 7.0 5.0 - 8.0   Glucose, UA NEGATIVE NEGATIVE mg/dL   Hgb urine dipstick SMALL (A) NEGATIVE   Bilirubin Urine NEGATIVE NEGATIVE   Ketones, ur NEGATIVE NEGATIVE mg/dL   Protein, ur NEGATIVE NEGATIVE mg/dL   Nitrite NEGATIVE NEGATIVE   Leukocytes, UA LARGE (A) NEGATIVE  Urinalysis, Microscopic (reflex)   Collection Time: 05/16/17 10:45 AM  Result Value Ref Range   RBC / HPF 0-5 0 - 5 RBC/hpf   WBC, UA >50 0 - 5 WBC/hpf   Bacteria, UA MANY (A) NONE SEEN   Squamous Epithelial / LPF >50 0 - 5  Results for orders placed or performed during the hospital encounter of 05/14/17 (from the past 336 hour(s))  CBC   Collection Time: 05/14/17  2:58 PM  Result Value Ref Range   WBC 6.9 4.0 - 10.5 K/uL  RBC 4.77 3.87 - 5.11 MIL/uL   Hemoglobin 13.4 12.0 - 15.0 g/dL   HCT 40.941.2 81.136.0 - 91.446.0 %   MCV 86.4 78.0 - 100.0 fL   MCH 28.1 26.0 - 34.0 pg   MCHC 32.5 30.0 - 36.0 g/dL   RDW 78.213.4 95.611.5 - 21.315.5 %   Platelets 277 150 - 400 K/uL  Comprehensive metabolic panel   Collection Time: 05/14/17  2:58 PM  Result Value Ref Range   Sodium 137 135 - 145 mmol/L   Potassium 3.6 3.5 - 5.1 mmol/L   Chloride 106 101 - 111 mmol/L   CO2 22 22 - 32 mmol/L   Glucose, Bld 104 (H) 65 - 99 mg/dL   BUN 12 6 - 20 mg/dL   Creatinine, Ser 0.860.62 0.44 - 1.00 mg/dL   Calcium 9.1 8.9 - 57.810.3 mg/dL   Total Protein 7.8 6.5 - 8.1 g/dL   Albumin 4.5 3.5 - 5.0 g/dL   AST 15 15 - 41 U/L   ALT 15 14 - 54 U/L   Alkaline Phosphatase 53 38 - 126 U/L   Total Bilirubin 0.3 0.3 - 1.2 mg/dL   GFR calc non Af Amer >60 >60 mL/min   GFR calc Af Amer >60 >60 mL/min   Anion gap 9 5 - 15  hCG, quantitative, pregnancy   Collection Time: 05/14/17  2:58 PM  Result Value Ref Range   hCG, Beta Chain, Quant, S <1 <5 mIU/mL  Rapid HIV screen (HIV 1/2 Ab+Ag)   Collection Time: 05/14/17  2:58 PM  Result Value Ref Range   HIV-1 P24 Antigen - HIV24 NON REACTIVE NON  REACTIVE   HIV 1/2 Antibodies NON REACTIVE NON REACTIVE   Interpretation (HIV Ag Ab)      A non reactive test result means that HIV 1 or HIV 2 antibodies and HIV 1 p24 antigen were not detected in the specimen.    EKG: No orders found for this or any previous visit.  Imaging Studies: [place] Transvaginal Non-ob  Result Date: 05/02/2017 GYNECOLOGIC SONOGRAM Ilsa IhaJennifer I Stampley is a 37 y.o. [id number] LMP 04/18/2017,she is here for a pelvic sonogram for menorrhagia. Uterus                      9 x 4.7 x 6.7 cm, volume 148 ml,normal homogeneous anteverted uterus Endometrium          12 mm, symmetrical, normal,no color flow Right ovary             3.3 x 2 x 2.5 cm, normal Left ovary                3 x 2.1 x 2.3 cm, normal No free fluid Technician Comments: PELVIC US TA/TV:normal homogeneous anteverted uterus,normal endometrium 12 mm,simple nabothian cyst 7 x 5 x 6 mm,normal right ovary,normal left ovary,no free fluid,ovaries appear mobile,no pain during ultrasound [employer] 05/01/2017 9:16 AM Clinical Impression and recommendations: I have reviewed the sonogram results above, combined with the patient's current clinical course, below are my impressions and any appropriate recommendations for management based on the sonographic findings. Normal uterus in size and appearance Endometrium is homogenous, normal width in a menstruating woman Both ovaries appear normal in size and appearance Lazaro ArmsLuther H Rodolphe [place] 05/02/2017 7:29 AM   [place] Pelvis (transabdominal Only)  Result Date: 05/02/2017 GYNECOLOGIC SONOGRAM Ilsa IhaJennifer I Dolinsky is a 37 y.o. [id number] LMP 04/18/2017,she is here for a pelvic sonogram for menorrhagia. Uterus  9 x 4.7 x 6.7 cm, volume 148 ml,normal homogeneous anteverted uterus Endometrium          12 mm, symmetrical, normal,no color flow Right ovary             3.3 x 2 x 2.5 cm, normal Left ovary                3 x 2.1 x 2.3 cm, normal No free fluid Technician Comments: PELVIC US TA/TV:normal  homogeneous anteverted uterus,normal endometrium 12 mm,simple nabothian cyst 7 x 5 x 6 mm,normal right ovary,normal left ovary,no free fluid,ovaries appear mobile,no pain during ultrasound [employer] 05/01/2017 9:16 AM Clinical Impression and recommendations: I have reviewed the sonogram results above, combined with the patient's current clinical course, below are my impressions and any appropriate recommendations for management based on the sonographic findings. Normal uterus in size and appearance Endometrium is homogenous, normal width in a menstruating woman Both ovaries appear normal in size and appearance [name] 05/02/2017 7:29 AM      Assessment: menometrorrhagia dysmenorrhea  Plan: Hysteroscopy uterine curettage endometrial ablation  [name] 05/16/2017 11:35 AM

## 2017-05-16 NOTE — Discharge Instructions (Signed)
Endometrial Ablation °Endometrial ablation is a procedure that destroys the thin inner layer of the lining of the uterus (endometrium). This procedure may be done: °· To stop heavy periods. °· To stop bleeding that is causing anemia. °· To control irregular bleeding. °· To treat bleeding caused by small tumors (fibroids) in the endometrium. ° °This procedure is often an alternative to major surgery, such as removal of the uterus and cervix (hysterectomy). As a result of this procedure: °· You may not be able to have children. However, if you are premenopausal (you have not gone through menopause): °? You may still have a small chance of getting pregnant. °? You will need to use a reliable method of birth control after the procedure to prevent pregnancy. °· You may stop having a menstrual period, or you may have only a small amount of bleeding during your period. Menstruation may return several years after the procedure. ° °Tell a health care provider about: °· Any allergies you have. °· All medicines you are taking, including vitamins, herbs, eye drops, creams, and over-the-counter medicines. °· Any problems you or family members have had with the use of anesthetic medicines. °· Any blood disorders you have. °· Any surgeries you have had. °· Any medical conditions you have. °What are the risks? °Generally, this is a safe procedure. However, problems may occur, including: °· A hole (perforation) in the uterus or bowel. °· Infection of the uterus, bladder, or vagina. °· Bleeding. °· Damage to other structures or organs. °· An air bubble in the lung (air embolus). °· Problems with pregnancy after the procedure. °· Failure of the procedure. °· Decreased ability to diagnose cancer in the endometrium. ° °What happens before the procedure? °· You will have tests of your endometrium to make sure there are no pre-cancerous cells or cancer cells present. °· You may have an ultrasound of the uterus. °· You may be given  medicines to thin the endometrium. °· Ask your health care provider about: °? Changing or stopping your regular medicines. This is especially important if you take diabetes medicines or blood thinners. °? Taking medicines such as aspirin and ibuprofen. These medicines can thin your blood. Do not take these medicines before your procedure if your doctor tells you not to. °· Plan to have someone take you home from the hospital or clinic. °What happens during the procedure? °· You will lie on an exam table with your feet and legs supported as in a pelvic exam. °· To lower your risk of infection: °? Your health care team will wash or sanitize their hands and put on germ-free (sterile) gloves. °? Your genital area will be washed with soap. °· An IV tube will be inserted into one of your veins. °· You will be given a medicine to help you relax (sedative). °· A surgical instrument with a light and camera (resectoscope) will be inserted into your vagina and moved into your uterus. This allows your surgeon to see inside your uterus. °· Endometrial tissue will be removed using one of the following methods: °? Radiofrequency. This method uses a radiofrequency-alternating electric current to remove the endometrium. °? Cryotherapy. This method uses extreme cold to freeze the endometrium. °? Heated-free liquid. This method uses a heated saltwater (saline) solution to remove the endometrium. °? Microwave. This method uses high-energy microwaves to heat up the endometrium and remove it. °? Thermal balloon. This method involves inserting a catheter with a balloon tip into the uterus. The balloon tip is   filled with heated fluid to remove the endometrium. °The procedure may vary among health care providers and hospitals. °What happens after the procedure? °· Your blood pressure, heart rate, breathing rate, and blood oxygen level will be monitored until the medicines you were given have worn off. °· As tissue healing occurs, you may  notice vaginal bleeding for 4-6 weeks after the procedure. You may also experience: °? Cramps. °? Thin, watery vaginal discharge that is light pink or brown in color. °? A need to urinate more frequently than usual. °? Nausea. °· Do not drive for 24 hours if you were given a sedative. °· Do not have sex or insert anything into your vagina until your health care provider approves. °Summary °· Endometrial ablation is done to treat the many causes of heavy menstrual bleeding. °· The procedure may be done only after medications have been tried to control the bleeding. °· Plan to have someone take you home from the hospital or clinic. °This information is not intended to replace advice given to you by your health care provider. Make sure you discuss any questions you have with your health care provider. °Document Released: 11/19/2003 Document Revised: 01/27/2016 Document Reviewed: 01/27/2016 °Elsevier Interactive Patient Education © 2017 [employer] ° ° ° ° °General Anesthesia, Adult, Care After °These instructions provide you with information about caring for yourself after your procedure. Your health care provider may also give you more specific instructions. Your treatment has been planned according to current medical practices, but problems sometimes occur. Call your health care provider if you have any problems or questions after your procedure. °What can I expect after the procedure? °After the procedure, it is common to have: °· Vomiting. °· A sore throat. °· Mental slowness. ° °It is common to feel: °· Nauseous. °· Cold or shivery. °· Sleepy. °· Tired. °· Sore or achy, even in parts of your body where you did not have surgery. ° °Follow these instructions at home: °For at least 24 hours after the procedure: °· Do not: °? Participate in activities where you could fall or become injured. °? Drive. °? Use heavy machinery. °? Drink alcohol. °? Take sleeping pills or medicines that cause drowsiness. °? Make  important decisions or sign legal documents. °? Take care of children on your own. °· Rest. °Eating and drinking °· If you vomit, drink water, juice, or soup when you can drink without vomiting. °· Drink enough fluid to keep your urine clear or pale yellow. °· Make sure you have little or no nausea before eating solid foods. °· Follow the diet recommended by your health care provider. °General instructions °· Have a responsible adult stay with you until you are awake and alert. °· Return to your normal activities as told by your health care provider. Ask your health care provider what activities are safe for you. °· Take over-the-counter and prescription medicines only as told by your health care provider. °· If you smoke, do not smoke without supervision. °· Keep all follow-up visits as told by your health care provider. This is important. °Contact a health care provider if: °· You continue to have nausea or vomiting at home, and medicines are not helpful. °· You cannot drink fluids or start eating again. °· You cannot urinate after 8-12 hours. °· You develop a skin rash. °· You have fever. °· You have increasing redness at the site of your procedure. °Get help right away if: °· You have difficulty breathing. °· You have chest   pain. °· You have unexpected bleeding. °· You feel that you are having a life-threatening or urgent problem. °This information is not intended to replace advice given to you by your health care provider. Make sure you discuss any questions you have with your health care provider. °Document Released: 04/17/2000 Document Revised: 06/14/2015 Document Reviewed: 12/24/2014 °Elsevier Interactive Patient Education © 2018 [employer] ° ° ° °

## 2017-05-16 NOTE — Anesthesia Postprocedure Evaluation (Signed)
Anesthesia Post Note  Patient: Rachel Carrillo  Procedure(s) Performed: DILATATION AND CURETTAGE /HYSTEROSCOPY (N/A ) ENDOMETRIAL ABLATION WITH MINERVA (N/A )  Patient location during evaluation: PACU Anesthesia Type: General Level of consciousness: awake and alert and patient cooperative Pain management: pain level controlled Vital Signs Assessment: post-procedure vital signs reviewed and stable Respiratory status: spontaneous breathing, nonlabored ventilation, respiratory function stable, patient connected to nasal cannula oxygen and patient connected to face mask oxygen Cardiovascular status: blood pressure returned to baseline Postop Assessment: no apparent nausea or vomiting Anesthetic complications: no     Last Vitals:  Vitals:   05/16/17 1052 05/16/17 1140  BP: 122/82 116/76  Pulse: 79   Resp: 16 14  Temp: 37.2 C   SpO2: 97% 97%    Last Pain:  Vitals:   05/16/17 1052  TempSrc: Oral  PainSc: 0-No pain                 Whitney Bingaman J

## 2017-05-17 ENCOUNTER — Encounter (HOSPITAL_COMMUNITY): Payer: Self-pay | Admitting: Obstetrics & Gynecology

## 2017-05-24 ENCOUNTER — Encounter: Payer: Self-pay | Admitting: Obstetrics & Gynecology

## 2017-05-24 ENCOUNTER — Other Ambulatory Visit: Payer: Self-pay

## 2017-05-24 ENCOUNTER — Ambulatory Visit (INDEPENDENT_AMBULATORY_CARE_PROVIDER_SITE_OTHER): Payer: Commercial Managed Care - PPO | Admitting: Obstetrics & Gynecology

## 2017-05-24 VITALS — BP 110/68 | HR 80 | Ht 65.0 in | Wt 191.0 lb

## 2017-05-24 DIAGNOSIS — Z9889 Other specified postprocedural states: Secondary | ICD-10-CM

## 2017-05-24 MED ORDER — PREDNISONE 10 MG PO TABS: ORAL_TABLET | ORAL | 0 refills | Status: DC

## 2017-05-24 NOTE — Progress Notes (Signed)
  HPI: Patient returns for routine postoperative follow-up having undergone hysteroscopy uterine curettage Minerva ablation on 05/16/2017.  The patient's immediate postoperative recovery has been unremarkable. Since hospital discharge the patient reports no complaints.   Current Outpatient Medications: HYDROcodone-acetaminophen (NORCO/VICODIN) 5-325 MG tablet, Take 1 tablet by mouth every 6 (six) hours as needed. (Patient not taking: Reported on 05/24/2017), Disp: 10 tablet, Rfl: 0 ketorolac (TORADOL) 10 MG tablet, Take 1 tablet (10 mg total) by mouth every 8 (eight) hours as needed. (Patient not taking: Reported on 05/24/2017), Disp: 15 tablet, Rfl: 0 ondansetron (ZOFRAN ODT) 8 MG disintegrating tablet, Take 1 tablet (8 mg total) by mouth every 8 (eight) hours as needed for nausea or vomiting. (Patient not taking: Reported on 05/24/2017), Disp: 20 tablet, Rfl: 0  No current facility-administered medications for this visit.     Blood pressure 110/68, pulse 80, height  (1.651 m), weight 191 lb (86.6 kg).  Physical Exam: Normal post ablation exam  Diagnostic Tests:   Pathology: secretroy endometrium, no atypia or hyperplasia  Impression: S/p endometrial ablation, normal post op course  Plan: Return in about 1 year (around 05/25/2018) for yearly.      [name], MD

## 2017-06-08 ENCOUNTER — Encounter (HOSPITAL_COMMUNITY): Payer: Self-pay | Admitting: Emergency Medicine

## 2017-06-08 ENCOUNTER — Other Ambulatory Visit: Payer: Self-pay

## 2017-06-08 ENCOUNTER — Emergency Department (HOSPITAL_COMMUNITY)
Admission: EM | Admit: 2017-06-08 | Discharge: 2017-06-09 | Disposition: A | Discharge status: Home / Self Care | Payer: Commercial Managed Care - PPO | Attending: Emergency Medicine | Admitting: Emergency Medicine

## 2017-06-08 DIAGNOSIS — Z87891 Personal history of nicotine dependence: Secondary | ICD-10-CM | POA: Diagnosis not present

## 2017-06-08 DIAGNOSIS — R1013 Epigastric pain: Secondary | ICD-10-CM | POA: Diagnosis not present

## 2017-06-08 DIAGNOSIS — R112 Nausea with vomiting, unspecified: Secondary | ICD-10-CM

## 2017-06-08 DIAGNOSIS — G43109 Migraine with aura, not intractable, without status migrainosus: Secondary | ICD-10-CM | POA: Diagnosis not present

## 2017-06-08 LAB — URINALYSIS, ROUTINE W REFLEX MICROSCOPIC
BILIRUBIN URINE: NEGATIVE
Bacteria, UA: NONE SEEN
Glucose, UA: NEGATIVE mg/dL
KETONES UR: 5 mg/dL — AB
NITRITE: NEGATIVE
Protein, ur: 100 mg/dL — AB
SPECIFIC GRAVITY, URINE: 1.024 (ref 1.005–1.030)
pH: 8 (ref 5.0–8.0)

## 2017-06-08 LAB — COMPREHENSIVE METABOLIC PANEL
ALBUMIN: 4.3 g/dL (ref 3.5–5.0)
ALK PHOS: 51 U/L (ref 38–126)
ALT: 23 U/L (ref 14–54)
AST: 20 U/L (ref 15–41)
Anion gap: 9 (ref 5–15)
BILIRUBIN TOTAL: 0.8 mg/dL (ref 0.3–1.2)
BUN: 9 mg/dL (ref 6–20)
CALCIUM: 9.1 mg/dL (ref 8.9–10.3)
CO2: 26 mmol/L (ref 22–32)
CREATININE: 0.54 mg/dL (ref 0.44–1.00)
Chloride: 105 mmol/L (ref 101–111)
GFR calc Af Amer: >60 mL/min (ref >60)
GFR calc non Af Amer: >60 mL/min (ref >60)
Glucose, Bld: 114 mg/dL — ABNORMAL HIGH (ref 65–99)
Potassium: 3.6 mmol/L (ref 3.5–5.1)
Sodium: 140 mmol/L (ref 135–145)
TOTAL PROTEIN: 7.3 g/dL (ref 6.5–8.1)

## 2017-06-08 LAB — LIPASE, BLOOD: Lipase: 28 U/L (ref 11–51)

## 2017-06-08 LAB — CBC
HEMATOCRIT: 42.1 % (ref 36.0–46.0)
HEMOGLOBIN: 13.9 g/dL (ref 12.0–15.0)
MCH: 28.7 pg (ref 26.0–34.0)
MCHC: 33 g/dL (ref 30.0–36.0)
MCV: 87 fL (ref 78.0–100.0)
Platelets: 269 10*3/uL (ref 150–400)
RBC: 4.84 MIL/uL (ref 3.87–5.11)
RDW: 12.7 % (ref 11.5–15.5)
WBC: 14.8 10*3/uL — AB (ref 4.0–10.5)

## 2017-06-08 MED ORDER — DIPHENHYDRAMINE HCL 50 MG/ML IJ SOLN
25.0000 mg | Freq: Once | INTRAMUSCULAR | Status: AC
  Administered 2017-06-08: 25 mg via INTRAVENOUS
  Filled 2017-06-08: qty 1

## 2017-06-08 MED ORDER — ONDANSETRON 4 MG PO TBDP
ORAL_TABLET | ORAL | Status: AC
  Administered 2017-06-08: 4 mg via ORAL
  Filled 2017-06-08: qty 1

## 2017-06-08 MED ORDER — DEXAMETHASONE SODIUM PHOSPHATE 10 MG/ML IJ SOLN
10.0000 mg | Freq: Once | INTRAMUSCULAR | Status: AC
  Administered 2017-06-08: 10 mg via INTRAVENOUS
  Filled 2017-06-08: qty 1

## 2017-06-08 MED ORDER — PROCHLORPERAZINE EDISYLATE 10 MG/2ML IJ SOLN
10.0000 mg | Freq: Once | INTRAMUSCULAR | Status: AC
  Administered 2017-06-08: 10 mg via INTRAVENOUS
  Filled 2017-06-08: qty 2

## 2017-06-08 MED ORDER — ONDANSETRON 4 MG PO TBDP
4.0000 mg | ORAL_TABLET | Freq: Once | ORAL | Status: AC | PRN
  Administered 2017-06-08: 4 mg via ORAL

## 2017-06-08 MED ORDER — SODIUM CHLORIDE 0.9 % IV BOLUS
1000.0000 mL | Freq: Once | INTRAVENOUS | Status: AC
  Administered 2017-06-08: 1000 mL via INTRAVENOUS

## 2017-06-08 NOTE — ED Triage Notes (Signed)
N/V since yesterday.

## 2017-06-08 NOTE — ED Provider Notes (Signed)
[facility] EMERGENCY DEPARTMENT Provider Note   CSN: [id number] Arrival date & time: 06/08/17  2008     History   Chief Complaint Chief Complaint  Patient presents with  . Emesis    HPI Rachel Carrillo is a 37 y.o. female with a history of migraine headaches and is almost 4 weeks out from an endometrial ablation presenting with a 2-day history of multiple complaints including nausea with occasional emesis, headache, photophobia and generalized fatigue and weakness.  Her headache is been constant and bilateral frontal and is more intense than her typical migraine but location is similar.  She denies phonophobia has had no fevers documented, although has had episodic chills.  She has had no nasal congestion, sinus pain, shortness of breath, sore throat chest pain.  She does endorse epigastric discomfort, has had 3 episodes of emesis today.  She has Maxalt tablets for her migraine, has tried this medication today but was unable to keep it down.  She denies focal weakness, neck pain or stiffness, rash, denies tick bites.  She has found no alleviators for her symptoms.  The history is provided by the patient and the spouse.    Past Medical History:  Diagnosis Date  . HSV-2 infection     Patient Active Problem List   Diagnosis Date Noted  . Status post postpartum tubal ligation after SVD 02/18/2011    Past Surgical History:  Procedure Laterality Date  . ENDOMETRIAL ABLATION N/A 05/16/2017   Procedure: ENDOMETRIAL ABLATION WITH MINERVA;  Surgeon: [name], MD;  Location: [facility];  Service: Gynecology;  Laterality: N/A;  . HYSTEROSCOPY W/D&C N/A 05/16/2017   Procedure: DILATATION AND CURETTAGE /HYSTEROSCOPY;  Surgeon: [name], MD;  Location: [facility];  Service: Gynecology;  Laterality: N/A;  . TUBAL LIGATION  02/18/2011   Procedure: POST PARTUM TUBAL LIGATION;  Surgeon: [name], MD;  Location: [facility];  Service: Gynecology;  Laterality: Bilateral;  Post partum tubal  ligation bilateral with filshie clips.  . WISDOM TOOTH EXTRACTION       OB History    Gravida  6   Para  3   Term  3   Preterm      AB  3   Living  3     SAB  3   TAB      Ectopic      Multiple      Live Births  1            Home Medications    Prior to Admission medications   Medication Sig Start Date End Date Taking? Authorizing Provider  ondansetron (ZOFRAN ODT) 8 MG disintegrating tablet Take 1 tablet (8 mg total) by mouth every 8 (eight) hours as needed for nausea or vomiting. 05/16/17  Yes [name], MD  rizatriptan (MAXALT) 10 MG tablet Take 10 mg by mouth as needed for migraine. May repeat in 2 hours if needed   Yes [provider]  HYDROcodone-acetaminophen (NORCO/VICODIN) 5-325 MG tablet Take 1 tablet by mouth every 6 (six) hours as needed. Patient not taking: Reported on 05/24/2017 05/16/17   [name], MD  ketorolac (TORADOL) 10 MG tablet Take 1 tablet (10 mg total) by mouth every 8 (eight) hours as needed. Patient not taking: Reported on 05/24/2017 05/16/17   [name], MD  predniSONE (DELTASONE) 10 MG tablet Take 4 tablets all at once daily for 10 days Patient not taking: Reported on 06/08/2017 05/24/17   Despina Hidden,  Amaryllis Dyke, MD    Family History Family History  Problem Relation Age of Onset  . Hypertension Mother   . Other Mother        TIA  . Stroke Mother   . Irritable bowel syndrome Mother   . Diabetes Maternal Aunt   . Arthritis Maternal Aunt        rheumatoid  . Arthritis Maternal Uncle        rheumatoid  . Arthritis Maternal Grandmother        rheumatoid  . Crohn's disease Maternal Grandfather   . Cancer Maternal Grandfather   . Heart disease Paternal Grandfather   . Other Paternal Grandfather        was on oxygen    Social History Social History   Tobacco Use  . Smoking status: Former Smoker    Packs/day: 0.25    Types: Cigarettes  . Smokeless tobacco: Never Used  Substance Use Topics  . Alcohol use: No  .  Drug use: No     Allergies   Patient has no known allergies.   Review of Systems Review of Systems  Constitutional: Positive for chills and fatigue. Negative for fever.  HENT: Negative for congestion, sinus pain and sore throat.   Eyes: Positive for photophobia.  Respiratory: Negative for chest tightness and shortness of breath.   Cardiovascular: Negative for chest pain.  Gastrointestinal: Positive for nausea and vomiting. Negative for abdominal pain, constipation and diarrhea.  Genitourinary: Negative.  Negative for dysuria, hematuria and vaginal discharge.  Musculoskeletal: Negative for arthralgias, joint swelling and neck pain.  Skin: Negative.  Negative for rash and wound.  Neurological: Positive for headaches. Negative for dizziness, weakness, light-headedness and numbness.  Psychiatric/Behavioral: Negative.      Physical Exam Updated Vital Signs BP 121/76 (BP Location: Right Arm)   Pulse (!) 101   Temp (!) 97.4 F (36.3 C) (Oral)   Resp 18   SpO2 99%   Physical Exam  Constitutional: She is oriented to person, place, and time. She appears well-developed and well-nourished.  Uncomfortable appearing  HENT:  Head: Normocephalic and atraumatic.  Mouth/Throat: Oropharynx is clear and moist.  Eyes: Pupils are equal, round, and reactive to light. EOM are normal.  Neck: Normal range of motion. Neck supple.  Cardiovascular: Normal rate and normal heart sounds.  Pulmonary/Chest: Effort normal.  Abdominal: Soft. There is no tenderness.  Musculoskeletal: Normal range of motion.  Lymphadenopathy:    She has no cervical adenopathy.  Neurological: She is alert and oriented to person, place, and time. She has normal strength. No sensory deficit. Gait normal. GCS eye subscore is 4. GCS verbal subscore is 5. GCS motor subscore is 6.  Normal heel-shin, normal rapid alternating movements. Cranial nerves III-XII intact.  No pronator drift.  Skin: Skin is warm and dry. No rash noted.    Psychiatric: She has a normal mood and affect. Her speech is normal and behavior is normal. Thought content normal. Cognition and memory are normal.  Nursing note and vitals reviewed.    ED Treatments / Results  Labs (all labs ordered are listed, but only abnormal results are displayed) Labs Reviewed  COMPREHENSIVE METABOLIC PANEL - Abnormal; Notable for the following components:      Result Value   Glucose, Bld 114 (*)    All other components within normal limits  CBC - Abnormal; Notable for the following components:   WBC 14.8 (*)    All other components within normal limits  URINALYSIS, ROUTINE  W REFLEX MICROSCOPIC - Abnormal; Notable for the following components:   APPearance CLOUDY (*)    Hgb urine dipstick MODERATE (*)    Ketones, ur 5 (*)    Protein, ur 100 (*)    Leukocytes, UA TRACE (*)    All other components within normal limits  LIPASE, BLOOD  TSH    EKG None  Radiology No results found.  Procedures Procedures (including critical care time)  Medications Ordered in ED Medications  ondansetron (ZOFRAN-ODT) disintegrating tablet 4 mg (4 mg Oral Given 06/08/17 2120)  dexamethasone (DECADRON) injection 10 mg (10 mg Intravenous Given 06/08/17 2320)  prochlorperazine (COMPAZINE) injection 10 mg (10 mg Intravenous Given 06/08/17 2323)  diphenhydrAMINE (BENADRYL) injection 25 mg (25 mg Intravenous Given 06/08/17 2317)  sodium chloride 0.9 % bolus 1,000 mL (0 mLs Intravenous Stopped 06/09/17 0033)     Initial Impression / Assessment and Plan / ED Course  I have reviewed the triage vital signs and the nursing notes.  Pertinent labs & imaging results that were available during my care of the patient were reviewed by me and considered in my medical decision making (see chart for details).     Pt with near resolution of headache and no n/v once she received migraine cocktail.  She was also given IV fluids.  Advised f/u with pcp or recheck here for any worsened sx.  No  neuro deficits on exam, sx suggesting chronic migraine.  Final Clinical Impressions(s) / ED Diagnoses   Final diagnoses:  Migraine with aura and without status migrainosus, not intractable  Nausea and vomiting, intractability of vomiting not specified, unspecified vomiting type    ED Discharge Orders    None       Victoriano Lain 06/09/17 0057    Vanetta Mulders, MD 06/09/17 [id number]

## 2017-06-09 LAB — TSH: TSH: 1.089 u[IU]/mL (ref 0.350–4.500)

## 2017-06-09 NOTE — Discharge Instructions (Addendum)
Go home and sleep, your headache should be much better when you wake. Plan follow up with Dr. Margo Aye as you have scheduled, calling him sooner for any worsening or persistent symptoms.  Continue using your home Zofran and your migraine medication as instructed as needed.  Your lab tests are relatively normal this evening, except as discussed.  Remember that I have added a TSH level onto your blood work this evening given your chronic fatigue and weight gain.  Please ask your primary doctor to look for this lab result at your office visit.

## 2017-09-01 ENCOUNTER — Encounter (HOSPITAL_COMMUNITY): Payer: Self-pay | Admitting: Emergency Medicine

## 2017-09-01 ENCOUNTER — Emergency Department (HOSPITAL_COMMUNITY)
Admission: EM | Admit: 2017-09-01 | Discharge: 2017-09-01 | Disposition: A | Discharge status: Home / Self Care | Payer: Commercial Managed Care - PPO | Attending: Emergency Medicine | Admitting: Emergency Medicine

## 2017-09-01 ENCOUNTER — Other Ambulatory Visit: Payer: Self-pay

## 2017-09-01 DIAGNOSIS — Z87891 Personal history of nicotine dependence: Secondary | ICD-10-CM | POA: Insufficient documentation

## 2017-09-01 DIAGNOSIS — G43909 Migraine, unspecified, not intractable, without status migrainosus: Secondary | ICD-10-CM | POA: Insufficient documentation

## 2017-09-01 DIAGNOSIS — R51 Headache: Secondary | ICD-10-CM | POA: Diagnosis present

## 2017-09-01 HISTORY — DX: Migraine, unspecified, not intractable, without status migrainosus: G43.909

## 2017-09-01 MED ORDER — SODIUM CHLORIDE 0.9 % IV BOLUS
1000.0000 mL | Freq: Once | INTRAVENOUS | Status: AC
  Administered 2017-09-01: 1000 mL via INTRAVENOUS

## 2017-09-01 MED ORDER — DIPHENHYDRAMINE HCL 50 MG/ML IJ SOLN
25.0000 mg | Freq: Once | INTRAMUSCULAR | Status: AC
  Administered 2017-09-01: 25 mg via INTRAVENOUS
  Filled 2017-09-01: qty 1

## 2017-09-01 MED ORDER — DEXAMETHASONE SODIUM PHOSPHATE 4 MG/ML IJ SOLN
10.0000 mg | Freq: Once | INTRAMUSCULAR | Status: AC
  Administered 2017-09-01: 10 mg via INTRAVENOUS
  Filled 2017-09-01: qty 3

## 2017-09-01 MED ORDER — ONDANSETRON HCL 4 MG/2ML IJ SOLN
4.0000 mg | Freq: Once | INTRAMUSCULAR | Status: AC
  Administered 2017-09-01: 4 mg via INTRAVENOUS
  Filled 2017-09-01: qty 2

## 2017-09-01 MED ORDER — PROCHLORPERAZINE EDISYLATE 10 MG/2ML IJ SOLN
10.0000 mg | Freq: Once | INTRAMUSCULAR | Status: AC
  Administered 2017-09-01: 10 mg via INTRAVENOUS
  Filled 2017-09-01: qty 2

## 2017-09-01 NOTE — Discharge Instructions (Addendum)
Follow up with your doctor this week for recheck, discuss further migraine management to abort/control headaches at home. Return to ER for worsening or concerning symptoms.

## 2017-09-01 NOTE — ED Notes (Signed)
HB in for reassessment

## 2017-09-01 NOTE — ED Provider Notes (Signed)
PT'S CARE CONTINUED FROM [place].  Patient is a 37 year old female who presents to the emergency department with a complaint of headaches.  Patient states she has a history of migraine headaches.  She has been having headache pain and nausea vomiting.  Patient had been treated in the emergency department with IV fluids, Decadron, Benadryl, and Compazine.  Recheck.  The neurologic examination is certainly within normal limits.  The patient states that her headache feels better, but she feels shaky and jittery.  The patient's coordination and speech are all clear.  The patient feels that she can handle her headache with her current medication at home at this time.  I have advised the patient to return to the emergency department if her current medications are not effective in controlling her headache pain or her condition worsens.  Patient is in agreement with this plan.   Ivery QualeBryant, Brodi Kari, PA-C 09/01/17 1717    Mancel BaleWentz, Elliott, MD 09/01/17 2009

## 2017-09-01 NOTE — ED Notes (Signed)
Pt reports that she wonders if this headache os related to surgery she had in may  Reports similar episode once before when she had anesthesia  Encouraged to ask regarding sx to GYN

## 2017-09-01 NOTE — ED Notes (Signed)
Ambulated to exit with family

## 2017-09-01 NOTE — ED Provider Notes (Signed)
[facility] Provider Note   CSN: [id number] Arrival date & time: 09/01/17  1514     History   Chief Complaint Chief Complaint  Patient presents with  . Migraine    HPI Rachel Carrillo is a 37 y.o. female.  37 year old female presents with complaint of migraine headache onset this morning.  Last headache similar to this was in May, denies any new or different symptoms.  Pain is located across her forehead, associated with light sensitivity, nausea, vomiting.  Patient took her Zofran without relief and states this causes her to vomit.  Denies injuries, fevers, chills, sudden severe onset, changes in gait, speech, vision.  No other complaints or concerns.     Past Medical History:  Diagnosis Date  . HSV-2 infection   . Migraine     Patient Active Problem List   Diagnosis Date Noted  . Status post postpartum tubal ligation after SVD 02/18/2011    Past Surgical History:  Procedure Laterality Date  . ENDOMETRIAL ABLATION N/A 05/16/2017   Procedure: ENDOMETRIAL ABLATION WITH MINERVA;  Surgeon: Lazaro ArmsEure, Luther H, MD;  Location: [facility];  Service: Gynecology;  Laterality: N/A;  . HYSTEROSCOPY W/D&C N/A 05/16/2017   Procedure: DILATATION AND CURETTAGE /HYSTEROSCOPY;  Surgeon: Lazaro ArmsEure, Luther H, MD;  Location: [facility];  Service: Gynecology;  Laterality: N/A;  . TUBAL LIGATION  02/18/2011   Procedure: POST PARTUM TUBAL LIGATION;  Surgeon: Tereso NewcomerUgonna A Anyanwu, MD;  Location: [facility];  Service: Gynecology;  Laterality: Bilateral;  Post partum tubal ligation bilateral with filshie clips.  . WISDOM TOOTH EXTRACTION       OB History    Gravida  6   Para  3   Term  3   Preterm      AB  3   Living  3     SAB  3   TAB      Ectopic      Multiple      Live Births  1            Home Medications    Prior to Admission medications   Medication Sig Start Date End Date Taking? Authorizing Provider  ondansetron (ZOFRAN ODT) 8 MG disintegrating tablet Take 1  tablet (8 mg total) by mouth every 8 (eight) hours as needed for nausea or vomiting. 05/16/17   Lazaro ArmsEure, Luther H, MD  rizatriptan (MAXALT) 10 MG tablet Take 10 mg by mouth as needed for migraine. May repeat in 2 hours if needed    [provider]    Family History Family History  Problem Relation Age of Onset  . Hypertension Mother   . Other Mother        TIA  . Stroke Mother   . Irritable bowel syndrome Mother   . Diabetes Maternal Aunt   . Arthritis Maternal Aunt        rheumatoid  . Arthritis Maternal Uncle        rheumatoid  . Arthritis Maternal Grandmother        rheumatoid  . Crohn's disease Maternal Grandfather   . Cancer Maternal Grandfather   . Heart disease Paternal Grandfather   . Other Paternal Grandfather        was on oxygen    Social History Social History   Tobacco Use  . Smoking status: Former Smoker    Packs/day: 0.25    Types: Cigarettes  . Smokeless tobacco: Never Used  Substance Use Topics  . Alcohol use: No  .  Drug use: No     Allergies   Patient has no known allergies.   Review of Systems Review of Systems  Constitutional: Negative for chills and fever.  Eyes: Negative for visual disturbance.  Gastrointestinal: Positive for nausea and vomiting. Negative for abdominal pain.  Musculoskeletal: Negative for gait problem, neck pain and neck stiffness.  Skin: Negative for rash and wound.  Allergic/Immunologic: Negative for immunocompromised state.  Neurological: Positive for headaches. Negative for dizziness, speech difficulty, weakness and numbness.  Hematological: Negative for adenopathy.  Psychiatric/Behavioral: Negative for confusion.  All other systems reviewed and are negative.    Physical Exam Updated Vital Signs BP 111/68 (BP Location: Right Arm)   Pulse 88   Temp 98.4 F (36.9 C) (Oral)   Resp 16   Ht 5\' 4"  (1.626 m)   Wt 88.5 kg   SpO2 100%   BMI 33.47 kg/m   Physical Exam  Constitutional: She is oriented to  person, place, and time. She appears well-developed and well-nourished. No distress.  HENT:  Head: Normocephalic and atraumatic.  Mouth/Throat: Oropharynx is clear and moist. No oropharyngeal exudate.  Eyes: Pupils are equal, round, and reactive to light. EOM are normal.  Neck: Normal range of motion. Neck supple.  Cardiovascular: Intact distal pulses.  Pulmonary/Chest: Effort normal.  Neurological: She is alert and oriented to person, place, and time. She has normal strength. She displays normal reflexes. No cranial nerve deficit or sensory deficit. Gait normal. GCS eye subscore is 4. GCS verbal subscore is 5. GCS motor subscore is 6.  Skin: Skin is warm and dry. No rash noted. She is not diaphoretic.  Psychiatric: She has a normal mood and affect. Her behavior is normal.  Nursing note and vitals reviewed.    ED Treatments / Results  Labs (all labs ordered are listed, but only abnormal results are displayed) Labs Reviewed - No data to display  EKG None  Radiology No results found.  Procedures Procedures (including critical care time)  Medications Ordered in ED Medications  sodium chloride 0.9 % bolus 1,000 mL (has no administration in time range)  ondansetron (ZOFRAN) injection 4 mg (has no administration in time range)  dexamethasone (DECADRON) injection 10 mg (has no administration in time range)  prochlorperazine (COMPAZINE) injection 10 mg (has no administration in time range)  diphenhydrAMINE (BENADRYL) injection 25 mg (has no administration in time range)     Initial Impression / Assessment and Plan / ED Course  I have reviewed the triage vital signs and the nursing notes.  Pertinent labs & imaging results that were available during my care of the patient were reviewed by me and considered in my medical decision making (see chart for details).  Clinical Course as of Sep 01 1608  Sat Sep 01, 2017  [id number] 37 year old female with history of migraines presents with her  typical migraine.  Last similar headache was in May of this year, seen at that time and headache improved with IV fluids and migraine cocktail.  Patient denies any new or different symptoms from her typical migraines.  Patient was given Zofran, Decadron Compazine, Benadryl and fluid bolus.  Patient will be discharged if symptoms improve.   [LM]    Clinical Course User Index [LM] Jeannie Fend, PA-C    Final Clinical Impressions(s) / ED Diagnoses   Final diagnoses:  Migraine without status migrainosus, not intractable, unspecified migraine type    ED Discharge Orders    None       Eulah Pont,  Gerome Apley, PA-C 09/01/17 1610    Eber Hong, MD 09/02/17 [phone]

## 2017-09-01 NOTE — ED Triage Notes (Signed)
Patient c/o migraine headache that started this morning. Per patient nausea, vomiting, and photosensitivity. Patient taking zofran but states makes nausea worse. Patient takes rizatripton for migraines but unable to due to vomiting.

## 2017-09-01 NOTE — ED Notes (Signed)
Pt complains of migraine since this morning   Has meds rx's by Dr Despina HiddenEure for same but only took zofran which she reports made her worse  Pt is neuro intact, actively vomiting, photophobic  SO at bedside

## 2019-07-09 ENCOUNTER — Ambulatory Visit
Admission: EM | Admit: 2019-07-09 | Discharge: 2019-07-09 | Disposition: A | Discharge status: Home / Self Care | Payer: Commercial Managed Care - PPO | Attending: Family Medicine | Admitting: Family Medicine

## 2019-07-09 DIAGNOSIS — R112 Nausea with vomiting, unspecified: Secondary | ICD-10-CM | POA: Diagnosis not present

## 2019-07-09 DIAGNOSIS — R1030 Lower abdominal pain, unspecified: Secondary | ICD-10-CM | POA: Diagnosis not present

## 2019-07-09 DIAGNOSIS — K529 Noninfective gastroenteritis and colitis, unspecified: Secondary | ICD-10-CM

## 2019-07-09 DIAGNOSIS — K2100 Gastro-esophageal reflux disease with esophagitis, without bleeding: Secondary | ICD-10-CM | POA: Diagnosis present

## 2019-07-09 DIAGNOSIS — R519 Headache, unspecified: Secondary | ICD-10-CM

## 2019-07-09 MED ORDER — OMEPRAZOLE 20 MG PO CPDR: 20.0000 mg | DELAYED_RELEASE_CAPSULE | Freq: Every day | ORAL | 0 refills | Status: DC

## 2019-07-09 MED ORDER — DICYCLOMINE HCL 20 MG PO TABS: 20.0000 mg | ORAL_TABLET | Freq: Two times a day (BID) | ORAL | 0 refills | Status: DC

## 2019-07-09 NOTE — Discharge Instructions (Addendum)
Take the dicyclomine twice daily as needed for abdominal cramping You may actually take this medication every 4 hours as needed  I would try omeprazole as well, one tablet daily  Follow up with this office or with primary care as needed

## 2019-07-09 NOTE — ED Triage Notes (Signed)
Pt presents with c/o lower abdominal pain that comes and goes, pt had migraine last night and vomited a couple times. Pt states she has zofran yesterday, no vomiting today just the abdominal pain

## 2019-07-09 NOTE — ED Provider Notes (Signed)
[facility] CARE [facility]ENTER   [id number] 07/09/19 Arrival Time: 0804  CC: ABDOMINAL PAIN  SUBJECTIVE:  Rachel Carrillo is a 39 y.o. female who presents with complaint of abdominal discomfort that began gradually 2 days ago. Denies a precipitating event, trauma, close contacts with similar symptoms, recent travel or antibiotic use. Localizes pain to lower abdomen. Describes as intermittent and cramping in character. Has tried OTC medications and Zofran without relief. Denies alleviating or aggravating factors. Denies similar symptoms in the past. Last BM yesterday.    Denies fever, chills, appetite changes, weight changes, chest pain, SOB, diarrhea, constipation, hematochezia, melena, dysuria, difficulty urinating, increased frequency or urgency, flank pain, loss of bowel or bladder function, vaginal discharge, vaginal odor, vaginal bleeding, dyspareunia, pelvic pain.     No LMP recorded. Patient has had an ablation.  ROS: As per HPI.  All other pertinent ROS negative.     Past Medical History:  Diagnosis Date  . HSV-2 infection   . Migraine    Past Surgical History:  Procedure Laterality Date  . ENDOMETRIAL ABLATION N/A 05/16/2017   Procedure: ENDOMETRIAL ABLATION WITH MINERVA;  Surgeon: [name], MD;  Location: [facility];  Service: Gynecology;  Laterality: N/A;  . HYSTEROSCOPY WITH D & C N/A 05/16/2017   Procedure: DILATATION AND CURETTAGE /HYSTEROSCOPY;  Surgeon: [name], MD;  Location: [facility];  Service: Gynecology;  Laterality: N/A;  . TUBAL LIGATION  02/18/2011   Procedure: POST PARTUM TUBAL LIGATION;  Surgeon: [name], MD;  Location: [facility];  Service: Gynecology;  Laterality: Bilateral;  Post partum tubal ligation bilateral with filshie clips.  . WISDOM TOOTH EXTRACTION     No Known Allergies No current facility-administered medications on file prior to encounter.   Current Outpatient Medications on File Prior to Encounter  Medication Sig Dispense Refill  .  ondansetron (ZOFRAN ODT) 8 MG disintegrating tablet Take 1 tablet (8 mg total) by mouth every 8 (eight) hours as needed for nausea or vomiting. 20 tablet 0  . rizatriptan (MAXALT) 10 MG tablet Take 10 mg by mouth as needed for migraine. May repeat in 2 hours if needed     Social History   Socioeconomic History  . Marital status: Legally Separated    Spouse name: Not on file  . Number of children: Not on file  . Years of education: Not on file  . Highest education level: Not on file  Occupational History  . Not on file  Tobacco Use  . Smoking status: Former Smoker    Packs/day: 0.25    Types: Cigarettes  . Smokeless tobacco: Never Used  Vaping Use  . Vaping Use: Never used  Substance and Sexual Activity  . Alcohol use: No  . Drug use: No  . Sexual activity: Not Currently    Birth control/protection: None, Surgical    Comment: tubal  Other Topics Concern  . Not on file  Social History Narrative  . Not on file   Social Determinants of Health   Financial Resource Strain:   . Difficulty of Paying Living Expenses:   Food Insecurity:   . Worried About Programme researcher, broadcasting/film/video in the Last Year:   . Barista in the Last Year:   Transportation Needs:   . Freight forwarder (Medical):   Marland Kitchen Lack of Transportation (Non-Medical):   Physical Activity:   . Days of Exercise per Week:   . Minutes of Exercise per Session:   Stress:   .  Feeling of Stress :   Social Connections:   . Frequency of Communication with Friends and Family:   . Frequency of Social Gatherings with Friends and Family:   . Attends Religious Services:   . Active Member of Clubs or Organizations:   . Attends Archivist Meetings:   Marland Kitchen Marital Status:   Intimate Partner Violence:   . Fear of Current or Ex-Partner:   . Emotionally Abused:   Marland Kitchen Physically Abused:   . Sexually Abused:    Family History  Problem Relation Age of Onset  . Hypertension Mother   . Other Mother        TIA  . Stroke  Mother   . Irritable bowel syndrome Mother   . Diabetes Maternal Aunt   . Arthritis Maternal Aunt        rheumatoid  . Arthritis Maternal Uncle        rheumatoid  . Arthritis Maternal Grandmother        rheumatoid  . Crohn's disease Maternal Grandfather   . Cancer Maternal Grandfather   . Heart disease Paternal Grandfather   . Other Paternal Grandfather        was on oxygen     OBJECTIVE:  Vitals:   07/09/19 0811  BP: 118/83  Pulse: 86  Resp: 18  Temp: 98.5 F (36.9 C)  SpO2: 98%    General appearance: Alert; NAD HEENT: NCAT.  Oropharynx clear.  Lungs: clear to auscultation bilaterally without adventitious breath sounds Heart: regular rate and rhythm.  Radial pulses 2+ symmetrical bilaterally Abdomen: soft, non-distended; normal active bowel sounds; non-tender to light and deep palpation; nontender at McBurney's point; negative Murphy's sign; negative rebound; no guarding Back: no CVA tenderness Extremities: no edema; symmetrical with no gross deformities Skin: warm and dry Neurologic: normal gait Psychological: alert and cooperative; normal mood and affect  LABS: No results found for this or any previous visit (from the past 24 hour(s)).  DIAGNOSTIC STUDIES: No results found.   ASSESSMENT & PLAN:  1. Noninfectious gastroenteritis, unspecified type   2. Lower abdominal pain   3. Nonintractable headache, unspecified chronicity pattern, unspecified headache type   4. Nausea and vomiting, intractability of vomiting not specified, unspecified vomiting type   5. Gastroesophageal reflux disease with esophagitis without hemorrhage     Meds ordered this encounter  Medications  . dicyclomine (BENTYL) 20 MG tablet    Sig: Take 1 tablet (20 mg total) by mouth 2 (two) times daily.    Dispense:  20 tablet    Refill:  0    Order Specific Question:   Supervising Provider    Answer:   Chase Picket [id number]  . omeprazole (PRILOSEC) 20 MG capsule    Sig: Take 1  capsule (20 mg total) by mouth daily.    Dispense:  30 capsule    Refill:  0    Order Specific Question:   Supervising Provider    Answer:   Chase Picket [[id number]]    Lower Abdominal Pain Nausea Vomiting Gastroenteritis GERD Prescribed Dicyclomine Prescribed Omeprazole Get rest and drink fluids Zofran at home, take as directed.    DIET Instructions:  30 minutes after taking nausea medicine, begin with sips of clear liquids. If able to hold down 2 - 4 ounces for 30 minutes, begin drinking more. Increase your fluid intake to replace losses. Clear liquids only for 24 hours (water, tea, sport drinks, clear flat ginger ale or cola and juices, broth, jello, popsicles, ect).  Advance to bland foods, applesauce, rice, baked or boiled chicken, ect. Avoid milk, greasy foods and anything that doesn't agree with you.  If you experience new or worsening symptoms return or go to ER such as fever, chills, nausea, vomiting, diarrhea, bloody or dark tarry stools, constipation, urinary symptoms, worsening abdominal discomfort, symptoms that do not improve with medications, inability to keep fluids down.  Reviewed expectations re: course of current medical issues. Questions answered. Outlined signs and symptoms indicating need for more acute intervention. Patient verbalized understanding. After Visit Summary given.    Moshe Cipro, NP 07/09/19 [phone]

## 2019-07-25 ENCOUNTER — Emergency Department (HOSPITAL_COMMUNITY): Payer: Commercial Managed Care - PPO

## 2019-07-25 ENCOUNTER — Emergency Department (HOSPITAL_COMMUNITY)
Admission: EM | Admit: 2019-07-25 | Discharge: 2019-07-25 | Disposition: A | Discharge status: Home / Self Care | Payer: Commercial Managed Care - PPO | Attending: Emergency Medicine | Admitting: Emergency Medicine

## 2019-07-25 ENCOUNTER — Encounter (HOSPITAL_COMMUNITY): Payer: Self-pay | Admitting: *Deleted

## 2019-07-25 ENCOUNTER — Other Ambulatory Visit: Payer: Self-pay

## 2019-07-25 ENCOUNTER — Ambulatory Visit
Admission: EM | Admit: 2019-07-25 | Discharge: 2019-07-25 | Disposition: A | Discharge status: Home / Self Care | Payer: Commercial Managed Care - PPO | Source: Home / Self Care

## 2019-07-25 DIAGNOSIS — Z87891 Personal history of nicotine dependence: Secondary | ICD-10-CM | POA: Insufficient documentation

## 2019-07-25 DIAGNOSIS — K279 Peptic ulcer, site unspecified, unspecified as acute or chronic, without hemorrhage or perforation: Secondary | ICD-10-CM | POA: Diagnosis not present

## 2019-07-25 DIAGNOSIS — K8020 Calculus of gallbladder without cholecystitis without obstruction: Secondary | ICD-10-CM | POA: Insufficient documentation

## 2019-07-25 DIAGNOSIS — K5900 Constipation, unspecified: Secondary | ICD-10-CM | POA: Insufficient documentation

## 2019-07-25 DIAGNOSIS — R112 Nausea with vomiting, unspecified: Secondary | ICD-10-CM | POA: Diagnosis present

## 2019-07-25 LAB — CBC WITH DIFFERENTIAL/PLATELET
Abs Immature Granulocytes: 0.02 10*3/uL (ref 0.00–0.07)
Basophils Absolute: 0 10*3/uL (ref 0.0–0.1)
Basophils Relative: 0 %
Eosinophils Absolute: 0 10*3/uL (ref 0.0–0.5)
Eosinophils Relative: 0 %
HCT: 41.8 % (ref 36.0–46.0)
Hemoglobin: 13.9 g/dL (ref 12.0–15.0)
Immature Granulocytes: 0 %
Lymphocytes Relative: 11 %
Lymphs Abs: 1 10*3/uL (ref 0.7–4.0)
MCH: 29.3 pg (ref 26.0–34.0)
MCHC: 33.3 g/dL (ref 30.0–36.0)
MCV: 88 fL (ref 80.0–100.0)
Monocytes Absolute: 0.3 10*3/uL (ref 0.1–1.0)
Monocytes Relative: 3 %
Neutro Abs: 7.6 10*3/uL (ref 1.7–7.7)
Neutrophils Relative %: 86 %
Platelets: 259 10*3/uL (ref 150–400)
RBC: 4.75 MIL/uL (ref 3.87–5.11)
RDW: 12 % (ref 11.5–15.5)
WBC: 8.9 10*3/uL (ref 4.0–10.5)
nRBC: 0 % (ref 0.0–0.2)

## 2019-07-25 LAB — COMPREHENSIVE METABOLIC PANEL
ALT: 15 U/L (ref 0–44)
AST: 17 U/L (ref 15–41)
Albumin: 4.8 g/dL (ref 3.5–5.0)
Alkaline Phosphatase: 56 U/L (ref 38–126)
Anion gap: 11 (ref 5–15)
BUN: 10 mg/dL (ref 6–20)
CO2: 23 mmol/L (ref 22–32)
Calcium: 9.3 mg/dL (ref 8.9–10.3)
Chloride: 105 mmol/L (ref 98–111)
Creatinine, Ser: 0.55 mg/dL (ref 0.44–1.00)
GFR calc Af Amer: >60 mL/min (ref >60)
GFR calc non Af Amer: >60 mL/min (ref >60)
Glucose, Bld: 100 mg/dL — ABNORMAL HIGH (ref 70–99)
Potassium: 3.5 mmol/L (ref 3.5–5.1)
Sodium: 139 mmol/L (ref 135–145)
Total Bilirubin: 0.8 mg/dL (ref 0.3–1.2)
Total Protein: 7.9 g/dL (ref 6.5–8.1)

## 2019-07-25 LAB — URINALYSIS, ROUTINE W REFLEX MICROSCOPIC
Bilirubin Urine: NEGATIVE
Glucose, UA: NEGATIVE mg/dL
Hgb urine dipstick: NEGATIVE
Ketones, ur: 80 mg/dL — AB
Leukocytes,Ua: NEGATIVE
Nitrite: NEGATIVE
Protein, ur: NEGATIVE mg/dL
Specific Gravity, Urine: 1.02 (ref 1.005–1.030)
pH: 7 (ref 5.0–8.0)

## 2019-07-25 LAB — LIPASE, BLOOD: Lipase: 27 U/L (ref 11–51)

## 2019-07-25 MED ORDER — IOHEXOL 300 MG/ML  SOLN
100.0000 mL | Freq: Once | INTRAMUSCULAR | Status: AC | PRN
  Administered 2019-07-25: 100 mL via INTRAVENOUS

## 2019-07-25 MED ORDER — PANTOPRAZOLE SODIUM 40 MG IV SOLR
40.0000 mg | Freq: Once | INTRAVENOUS | Status: AC
  Administered 2019-07-25: 40 mg via INTRAVENOUS
  Filled 2019-07-25: qty 40

## 2019-07-25 MED ORDER — ALUM & MAG HYDROXIDE-SIMETH 200-200-20 MG/5ML PO SUSP
30.0000 mL | Freq: Once | ORAL | Status: AC
  Administered 2019-07-25: 30 mL via ORAL
  Filled 2019-07-25: qty 30

## 2019-07-25 MED ORDER — PANTOPRAZOLE SODIUM 20 MG PO TBEC: 20.0000 mg | DELAYED_RELEASE_TABLET | Freq: Every day | ORAL | 0 refills | Status: DC

## 2019-07-25 MED ORDER — SUCRALFATE 1 G PO TABS: 1.0000 g | ORAL_TABLET | Freq: Three times a day (TID) | ORAL | 0 refills | Status: DC

## 2019-07-25 MED ORDER — PROMETHAZINE HCL 12.5 MG PO TABS: 12.5000 mg | ORAL_TABLET | Freq: Four times a day (QID) | ORAL | 0 refills | Status: DC | PRN

## 2019-07-25 MED ORDER — PROMETHAZINE HCL 25 MG/ML IJ SOLN
12.5000 mg | INTRAMUSCULAR | Status: DC | PRN
  Administered 2019-07-25: 12.5 mg via INTRAVENOUS
  Filled 2019-07-25: qty 1

## 2019-07-25 MED ORDER — SODIUM CHLORIDE 0.9 % IV BOLUS
1000.0000 mL | Freq: Once | INTRAVENOUS | Status: AC
  Administered 2019-07-25: 1000 mL via INTRAVENOUS

## 2019-07-25 NOTE — ED Triage Notes (Signed)
Sent from urgent care for evaluation of chronic constipation

## 2019-07-25 NOTE — ED Notes (Signed)
Pt transported to CT ?

## 2019-07-25 NOTE — ED Notes (Signed)
Pt given gingerale. Pt states she feels better than she did.

## 2019-07-25 NOTE — ED Notes (Signed)
Patient is being discharged from the Urgent Care and sent to the Emergency Department via pov . Per K Avegno, patient is in need of higher level of care due to abd pain. Patient is aware and verbalizes understanding of plan of care. There were no vitals filed for this visit.  

## 2019-07-25 NOTE — ED Triage Notes (Signed)
Pt is having abd pain n/v that comes and goes for years.  States she has an upcoming appt with gi doctor. Pt is actively vomiting

## 2019-07-25 NOTE — Discharge Instructions (Addendum)
Today I have given you 3 prescriptions.  Protonix is a medicine to help reduce the acid in your stomach.  You need to take this medicine as soon as you wake up.   Carafate will help coat the lining of your stomach. Phenergan is a nausea and vomiting medication.  Your prescription is written for 1 tablet every 6 hours, however if this is not relieving your symptoms you may take a total of 2 tablets every 6 hours.  As we discussed today it looks like one of the clips that they used for your tubal ligation has migrated across your abdomen.  Please follow-up with your OB/GYN about this.  Today you received medications that may make you sleepy or impair your ability to make decisions.  For the next 24 hours please do not drive, operate heavy machinery, care for a small child with out another adult present, or perform any activities that may cause harm to you or someone else if you were to fall asleep or be impaired.   You are being prescribed a medication which may make you sleepy. Please follow up of listed precautions for at least 24 hours after taking one dose. (phenergan)

## 2019-07-25 NOTE — ED Notes (Signed)
Pt states she feels better and able to tolerate gingerale

## 2019-07-25 NOTE — ED Provider Notes (Signed)
[facility] EMERGENCY DEPARTMENT Provider Note   CSN: [id number] Arrival date & time: 07/25/19  1507     History Chief Complaint  Patient presents with  . Constipation    Rachel Carrillo is a 39 y.o. female with a past medical history of GERD, constipation, migraines, status post bilateral tubal ligation s and endometrial ablation who presents today for evaluation of multiple complaints.  She reports that she has chronic constipation, states she normally only has 1 or 2 small bowel movements a month.  She reports that today she felt okay when she woke up, she went to work after getting fasting blood work drawn, was able to eat grits and then ate a biscuit, about half an hour after started having a burning feeling in her throat and then began having nausea and diarrhea with vomiting.  She reports that she had 2 episodes of vomiting and 2 episodes of diarrhea.  She denies any blood in either.  She was seen at urgent care for this before coming here.  She notes that about 2 weeks ago she had a similar episode and that before today that was the last time she had a bowel movement.  She has tried MiraLAX, twice a day, and fiber supplements without relief in her constipation.  She denies any fevers.  She reports that her abdominal pain is cramping and feels like it is in her lower abdomen.  She does occasionally report right upper quadrant abdominal pain.  She denies any trauma.  She states that she has a prescription for Zofran however does not take it as it makes her feel cold, clammy and jittery.  She still has her gallbladder and appendix.  She is getting set up to see GI as an outpatient.  She denies abnormal Vaginal discharge or bleeding.  No dysuria, increased frequency or urgency.  HPI     Past Medical History:  Diagnosis Date  . HSV-2 infection   . Migraine     Patient Active Problem List   Diagnosis Date Noted  . Gastroesophageal reflux disease with esophagitis without hemorrhage  07/09/2019  . Status post postpartum tubal ligation after SVD 02/18/2011    Past Surgical History:  Procedure Laterality Date  . ENDOMETRIAL ABLATION N/A 05/16/2017   Procedure: ENDOMETRIAL ABLATION WITH MINERVA;  Surgeon: [name], MD;  Location: [facility];  Service: Gynecology;  Laterality: N/A;  . HYSTEROSCOPY WITH D & C N/A 05/16/2017   Procedure: DILATATION AND CURETTAGE /HYSTEROSCOPY;  Surgeon: [name], MD;  Location: [facility];  Service: Gynecology;  Laterality: N/A;  . TUBAL LIGATION  02/18/2011   Procedure: POST PARTUM TUBAL LIGATION;  Surgeon: [name], MD;  Location: [facility];  Service: Gynecology;  Laterality: Bilateral;  Post partum tubal ligation bilateral with filshie clips.  . WISDOM TOOTH EXTRACTION       OB History    Gravida  6   Para  3   Term  3   Preterm      AB  3   Living  3     SAB  3   TAB      Ectopic      Multiple      Live Births  1           Family History  Problem Relation Age of Onset  . Hypertension Mother   . Other Mother        TIA  . Stroke Mother   . Irritable bowel  syndrome Mother   . Diabetes Maternal Aunt   . Arthritis Maternal Aunt        rheumatoid  . Arthritis Maternal Uncle        rheumatoid  . Arthritis Maternal Grandmother        rheumatoid  . Crohn's disease Maternal Grandfather   . Cancer Maternal Grandfather   . Heart disease Paternal Grandfather   . Other Paternal Grandfather        was on oxygen    Social History   Tobacco Use  . Smoking status: Former Smoker    Packs/day: 0.25    Types: Cigarettes  . Smokeless tobacco: Never Used  Vaping Use  . Vaping Use: Never used  Substance Use Topics  . Alcohol use: No  . Drug use: No    Home Medications Prior to Admission medications   Medication Sig Start Date End Date Taking? Authorizing Provider  dicyclomine (BENTYL) 20 MG tablet Take 1 tablet (20 mg total) by mouth 2 (two) times daily. 07/09/19  Yes Moshe CiproMatthews, Stephanie, NP    rizatriptan (MAXALT) 10 MG tablet Take 10 mg by mouth as needed for migraine. May repeat in 2 hours if needed   Yes [provider]  omeprazole (PRILOSEC) 20 MG capsule Take 1 capsule (20 mg total) by mouth daily. 07/09/19 07/25/19 Yes Moshe CiproMatthews, Stephanie, NP  pantoprazole (PROTONIX) 20 MG tablet Take 1 tablet (20 mg total) by mouth daily. 07/25/19   Cristina GongHammond, Shley Dolby W, PA-C  promethazine (PHENERGAN) 12.5 MG tablet Take 1 tablet (12.5 mg total) by mouth every 6 (six) hours as needed for nausea or vomiting. 07/25/19   Cristina GongHammond, Mauri Temkin W, PA-C  sucralfate (CARAFATE) 1 g tablet Take 1 tablet (1 g total) by mouth 4 (four) times daily -  with meals and at bedtime. 07/25/19 08/24/19  Cristina GongHammond, Shirelle Tootle W, PA-C    Allergies    Patient has no known allergies.  Review of Systems   Review of Systems  Constitutional: Negative for chills and fever.  Respiratory: Negative for cough and shortness of breath.   Gastrointestinal: Positive for abdominal pain, constipation, diarrhea, nausea and vomiting.  Genitourinary: Negative for difficulty urinating, dysuria, frequency, pelvic pain and vaginal discharge.  Musculoskeletal: Negative for back pain and neck pain.  Skin: Negative for color change.  Neurological: Negative for weakness and headaches.  Psychiatric/Behavioral: Negative for behavioral problems.  All other systems reviewed and are negative.   Physical Exam Updated Vital Signs BP 104/68   Pulse 83   Temp 97.9 F (36.6 C)   Resp 18   SpO2 97%   Physical Exam Vitals and nursing note reviewed.  Constitutional:      Appearance: She is well-developed. She is not diaphoretic.     Comments: Appears to feel unwell.  Has empty vomit bag at side.   HENT:     Head: Normocephalic and atraumatic.     Mouth/Throat:     Mouth: Mucous membranes are moist.  Eyes:     General: No scleral icterus.       Right eye: No discharge.        Left eye: No discharge.     Conjunctiva/sclera: Conjunctivae  normal.  Cardiovascular:     Rate and Rhythm: Normal rate and regular rhythm.     Pulses: Normal pulses.  Pulmonary:     Effort: Pulmonary effort is normal. No respiratory distress.     Breath sounds: No stridor.  Abdominal:     General: There is no  distension.     Tenderness: There is abdominal tenderness (Diffuse, worse in LLQ. ).  Musculoskeletal:        General: No deformity.     Cervical back: Normal range of motion.  Skin:    General: Skin is warm and dry.  Neurological:     Mental Status: She is alert.     Cranial Nerves: No cranial nerve deficit.     Motor: No abnormal muscle tone.  Psychiatric:        Mood and Affect: Mood normal.        Behavior: Behavior normal.     ED Results / Procedures / Treatments   Labs (all labs ordered are listed, but only abnormal results are displayed) Labs Reviewed  COMPREHENSIVE METABOLIC PANEL - Abnormal; Notable for the following components:      Result Value   Glucose, Bld 100 (*)    All other components within normal limits  URINALYSIS, ROUTINE W REFLEX MICROSCOPIC - Abnormal; Notable for the following components:   APPearance HAZY (*)    Ketones, ur 80 (*)    All other components within normal limits  LIPASE, BLOOD  CBC WITH DIFFERENTIAL/PLATELET    EKG None  Radiology CT Abdomen Pelvis W Contrast  Result Date: 07/25/2019 CLINICAL DATA:  Nausea vomiting EXAM: CT ABDOMEN AND PELVIS WITH CONTRAST TECHNIQUE: Multidetector CT imaging of the abdomen and pelvis was performed using the standard protocol following bolus administration of intravenous contrast. CONTRAST:  OMNIPAQUE IOHEXOL 300 MG/ML  SOLN COMPARISON:  Pelvic ultrasound 05/01/2017 FINDINGS: Lower chest: Lung bases demonstrate no acute consolidation or effusion. Cardiac size within normal limits. Hepatobiliary: Multiple gallstones. No focal hepatic abnormality or biliary dilatation. Pancreas: Unremarkable. No pancreatic ductal dilatation or surrounding inflammatory  changes. Spleen: Borderline enlarged at 13 cm. Adrenals/Urinary Tract: Adrenal glands are normal. Kidneys show no hydronephrosis. The bladder is normal Stomach/Bowel: The stomach is nonenlarged. Slightly indistinct appearance of the pylorus. No dilated small bowel. No bowel wall thickening. Negative appendix. Vascular/Lymphatic: No significant vascular findings are present. No enlarged abdominal or pelvic lymph nodes. Reproductive: Uterus unremarkable. Left ligation clip is in place. A second ligation clip is seen along the left uterine body. No adnexal mass. Other: No abdominal wall hernia or abnormality. No abdominopelvic ascites. Musculoskeletal: No acute or significant osseous findings. IMPRESSION: 1. Indistinct slightly thickened appearance of pylorus, question peptic ulcer disease. 2. Gallstones. 3. Status post tubal ligation with displacement of right ligation clip to the left pelvis, adjacent to the body of the uterus. Electronically Signed   By: Jasmine Pang M.D.   On: 07/25/2019 18:33    Procedures Procedures (including critical care time)  Medications Ordered in ED Medications  promethazine (PHENERGAN) injection 12.5 mg (12.5 mg Intravenous Given 07/25/19 1818)  sodium chloride 0.9 % bolus 1,000 mL (0 mLs Intravenous Stopped 07/25/19 1757)  iohexol (OMNIPAQUE) 300 MG/ML solution 100 mL (100 mLs Intravenous Contrast Given 07/25/19 1737)  alum & mag hydroxide-simeth (MAALOX/MYLANTA) 200-200-20 MG/5ML suspension 30 mL (30 mLs Oral Given 07/25/19 1911)  pantoprazole (PROTONIX) injection 40 mg (40 mg Intravenous Given 07/25/19 1913)    ED Course  I have reviewed the triage vital signs and the nursing notes.  Pertinent labs & imaging results that were available during my care of the patient were reviewed by me and considered in my medical decision making (see chart for details).    MDM Rules/Calculators/A&P  Patient is a 39 year old woman who presents today for evaluation  of abdominal pain and vomiting that started after she ate a fatty meal.  CBC and CMP are obtained without acute abnormalities.  Lipase is not elevated.  UA with 80 ketones consistent with fasting state however without evidence of infection.  IV fluids and Phenergan were given.  She is treated with IV Protonix.  CT abdomen pelvis was obtained showing concern for peptic ulcer disease with multiple gallstones.  Dr. Henreitta Leber states she will see patient in follow-up in her office.    Patient is given GI referral for possible peptic ulcer disease.  She is treated with Maalox in the ER after which she is able to p.o. challenge without difficulty.  She is given prescriptions for Phenergan, Carafate, and Protonix.  Work note is given.  No evidence of cholecystitis at this time, recommended a low fat diet.   Return precautions were discussed with patient who states their understanding.  At the time of discharge patient denied any unaddressed complaints or concerns.  Patient is agreeable for discharge home.  Note: Portions of this report may have been transcribed using voice recognition software. Every effort was made to ensure accuracy; however, inadvertent computerized transcription errors may be present   Final Clinical Impression(s) / ED Diagnoses Final diagnoses:  Peptic ulcer disease  Calculus of gallbladder without cholecystitis without obstruction    Rx / DC Orders ED Discharge Orders         Ordered    pantoprazole (PROTONIX) 20 MG tablet  Daily     Discontinue  Reprint     07/25/19 2021    sucralfate (CARAFATE) 1 g tablet  3 times daily with meals & bedtime     Discontinue  Reprint     07/25/19 2021    promethazine (PHENERGAN) 12.5 MG tablet  Every 6 hours PRN     Discontinue  Reprint     07/25/19 2021           Cristina Gong, PA-C 07/25/19 2313    Derwood Kaplan, MD 07/26/19 0007

## 2019-07-25 NOTE — ED Notes (Signed)
Pt vomited 100 ml into emesis bag. Pt offered phenergan but declines at this time.

## 2019-07-29 ENCOUNTER — Other Ambulatory Visit: Payer: Self-pay

## 2019-07-29 ENCOUNTER — Encounter: Payer: Self-pay | Admitting: General Surgery

## 2019-07-29 ENCOUNTER — Ambulatory Visit (INDEPENDENT_AMBULATORY_CARE_PROVIDER_SITE_OTHER): Payer: Commercial Managed Care - PPO | Admitting: General Surgery

## 2019-07-29 ENCOUNTER — Encounter (INDEPENDENT_AMBULATORY_CARE_PROVIDER_SITE_OTHER): Payer: Self-pay | Admitting: Gastroenterology

## 2019-07-29 ENCOUNTER — Telehealth: Payer: Self-pay | Admitting: Family Medicine

## 2019-07-29 VITALS — BP 112/80 | HR 76 | Temp 97.3°F | Resp 14 | Ht 64.0 in | Wt 194.0 lb

## 2019-07-29 DIAGNOSIS — K279 Peptic ulcer, site unspecified, unspecified as acute or chronic, without hemorrhage or perforation: Secondary | ICD-10-CM

## 2019-07-29 DIAGNOSIS — K8020 Calculus of gallbladder without cholecystitis without obstruction: Secondary | ICD-10-CM

## 2019-07-29 MED ORDER — PANTOPRAZOLE SODIUM 40 MG PO TBEC: 40.0000 mg | DELAYED_RELEASE_TABLET | Freq: Two times a day (BID) | ORAL | 1 refills | Status: DC

## 2019-07-29 NOTE — Patient Instructions (Addendum)
Take Carafate with meals and at bedtime. Take Protonix 40mg  Twice a day. This is treatment for your peptic ulcer disease. Referral to GI for Endoscopy.   Peptic Ulcer  A peptic ulcer is a painful sore in the lining of your stomach or the first part of your small intestine. What are the causes? Common causes of this condition include:  An infection.  Using certain pain medicines too often or too much. What increases the risk? You are more likely to get this condition if you:  Smoke.  Have a family history of ulcer disease.  Drink alcohol.  Have been hospitalized in an intensive care unit (ICU). What are the signs or symptoms? Symptoms include:  Burning pain in the area between the chest and the belly button. The pain may: ? Not go away (be persistent). ? Be worse when your stomach is empty. ? Be worse at night.  Heartburn.  Feeling sick to your stomach (nauseous) and throwing up (vomiting).  Bloating. If the ulcer results in bleeding, it can cause you to:  Have poop (stool) that is black and looks like tar.  Throw up bright red blood.  Throw up material that looks like coffee grounds. How is this treated? Treatment for this condition may include:  Stopping things that can cause the ulcer, such as: ? Smoking. ? Using pain medicines.  Medicines to reduce stomach acid.  Antibiotic medicines if the ulcer is caused by an infection.  A procedure that is done using a small, flexible tube that has a camera at the end (upper endoscopy). This may be done if you have a bleeding ulcer.  Surgery. This may be needed if: ? You have a lot of bleeding. ? The ulcer caused a hole somewhere in the digestive system. Follow these instructions at home:  Do not drink alcohol if your doctor tells you not to drink.  Limit how much caffeine you take in.  Do not use any products that contain nicotine or tobacco, such as cigarettes, e-cigarettes, and chewing tobacco. If you need  help quitting, ask your doctor.  Take over-the-counter and prescription medicines only as told by your doctor. ? Do not stop or change your medicines unless you talk with your doctor about it first. ? Do not take aspirin, ibuprofen, or other NSAIDs unless your doctor told you to do so.  Keep all follow-up visits as told by your doctor. This is important. Contact a doctor if:  You do not get better in 7 days after you start treatment.  You keep having an upset stomach (indigestion) or heartburn. Get help right away if:  You have sudden, sharp pain in your belly (abdomen).  You have belly pain that does not go away.  You have bloody poop (stool) or black, tarry poop.  You throw up blood. It may look like coffee grounds.  You feel light-headed or feel like you may pass out (faint).  You get weak.  You get sweaty or feel sticky and cold to the touch (clammy). Summary  Symptoms of a peptic ulcer include burning pain in the area between the chest and the belly button.  Take medicines only as told by your doctor.  Limit how much alcohol and caffeine you have.  Keep all follow-up visits as told by your doctor. This information is not intended to replace advice given to you by your health care provider. Make sure you discuss any questions you have with your health care provider. Document Revised: 07/17/2017 Document  Reviewed: 07/17/2017 Elsevier Patient Education  2020 [employer].   Cholelithiasis  Cholelithiasis is a form of gallbladder disease in which gallstones form in the gallbladder. The gallbladder is an organ that stores bile. Bile is made in the liver, and it helps to digest fats. Gallstones begin as small crystals and slowly grow into stones. They may cause no symptoms until the gallbladder tightens (contracts) and a gallstone is blocking the duct (gallbladder attack), which can cause pain. Cholelithiasis is also referred to as gallstones. There are two main types of  gallstones:  Cholesterol stones. These are made of hardened cholesterol and are usually yellow-green in color. They are the most common type of gallstone. Cholesterol is a white, waxy, fat-like substance that is made in the liver.  Pigment stones. These are dark in color and are made of a red-yellow substance that forms when hemoglobin from red blood cells breaks down (bilirubin). What are the causes? This condition may be caused by an imbalance in the substances that bile is made of. This can happen if the bile:  Has too much bilirubin.  Has too much cholesterol.  Does not have enough bile salts. These salts help the body absorb and digest fats. In some cases, this condition can also be caused by the gallbladder not emptying completely or often enough. What increases the risk? The following factors may make you more likely to develop this condition:  Being female.  Having multiple pregnancies. Health care providers sometimes advise removing diseased gallbladders before future pregnancies.  Eating a diet that is heavy in fried foods, fat, and refined carbohydrates, like white bread and white rice.  Being obese.  Being older than age 64.  Prolonged use of medicines that contain female hormones (estrogen).  Having diabetes mellitus.  Rapidly losing weight.  Having a family history of gallstones.  Being of [place] or [place] descent.  Having an intestinal disease such as Crohn disease.  Having metabolic syndrome.  Having cirrhosis.  Having severe types of anemia such as sickle cell anemia. What are the signs or symptoms? In most cases, there are no symptoms. These are known as silent gallstones. If a gallstone blocks the bile ducts, it can cause a gallbladder attack. The main symptom of a gallbladder attack is sudden pain in the upper right abdomen. The pain usually comes at night or after eating a large meal. The pain can last for one or several hours and can spread  to the right shoulder or chest. If the bile duct is blocked for more than a few hours, it can cause infection or inflammation of the gallbladder, liver, or pancreas, which may cause:  Nausea.  Vomiting.  Abdominal pain that lasts for 5 hours or more.  Fever or chills.  Yellowing of the skin or the whites of the eyes (jaundice).  Dark urine.  Light-colored stools. How is this diagnosed? This condition may be diagnosed based on:  A physical exam.  Your medical history.  An ultrasound of your gallbladder.  CT scan.  MRI.  Blood tests to check for signs of infection or inflammation.  A scan of your gallbladder and bile ducts (biliary system) using nonharmful radioactive material and special cameras that can see the radioactive material (cholescintigram). This test checks to see how your gallbladder contracts and whether bile ducts are blocked.  Inserting a small tube with a camera on the end (endoscope) through your mouth to inspect bile ducts and check for blockages (endoscopic retrograde cholangiopancreatogram). How is  this treated? Treatment for gallstones depends on the severity of the condition. Silent gallstones do not need treatment. If the gallstones cause a gallbladder attack or other symptoms, treatment may be required. Options for treatment include:  Surgery to remove the gallbladder (cholecystectomy). This is the most common treatment.  Medicines to dissolve gallstones. These are most effective at treating small gallstones. You may need to take medicines for up to 6-12 months.  Shock wave treatment (extracorporeal biliary lithotripsy). In this treatment, an ultrasound machine sends shock waves to the gallbladder to break gallstones into smaller pieces. These pieces can then be passed into the intestines or be dissolved by medicine. This is rarely used.  Removing gallstones through endoscopic retrograde cholangiopancreatogram. A small basket can be attached to the  endoscope and used to capture and remove gallstones. Follow these instructions at home:  Take over-the-counter and prescription medicines only as told by your health care provider.  Maintain a healthy weight and follow a healthy diet. This includes: ? Reducing fatty foods, such as fried food. ? Reducing refined carbohydrates, like white bread and white rice. ? Increasing fiber. Aim for foods like almonds, fruit, and beans.  Keep all follow-up visits as told by your health care provider. This is important. Contact a health care provider if:  You think you have had a gallbladder attack.  You have been diagnosed with silent gallstones and you develop abdominal pain or indigestion. Get help right away if:  You have pain from a gallbladder attack that lasts for more than 2 hours.  You have abdominal pain that lasts for more than 5 hours.  You have a fever or chills.  You have persistent nausea and vomiting.  You develop jaundice.  You have dark urine or light-colored stools. Summary  Cholelithiasis (also called gallstones) is a form of gallbladder disease in which gallstones form in the gallbladder.  This condition is caused by an imbalance in the substances that make up bile. This can happen if the bile has too much cholesterol, too much bilirubin, or not enough bile salts.  You are more likely to develop this condition if you are female, pregnant, using medicines with estrogen, obese, older than age 25, or have a family history of gallstones. You may also develop gallstones if you have diabetes, an intestinal disease, cirrhosis, or metabolic syndrome.  Treatment for gallstones depends on the severity of the condition. Silent gallstones do not need treatment.  If gallstones cause a gallbladder attack or other symptoms, treatment may be needed. The most common treatment is surgery to remove the gallbladder. This information is not intended to replace advice given to you by your  health care provider. Make sure you discuss any questions you have with your health care provider. Document Revised: 12/22/2016 Document Reviewed: 09/26/2015 Elsevier Patient Education  2020 Elsevier Inc.   Laparoscopic Cholecystectomy Laparoscopic cholecystectomy is surgery to remove the gallbladder. The gallbladder is a pear-shaped organ that lies beneath the liver on the right side of the body. The gallbladder stores bile, which is a fluid that helps the body to digest fats. Cholecystectomy is often done for inflammation of the gallbladder (cholecystitis). This condition is usually caused by a buildup of gallstones (cholelithiasis) in the gallbladder. Gallstones can block the flow of bile, which can result in inflammation and pain. In severe cases, emergency surgery may be required. This procedure is done though small incisions in your abdomen (laparoscopic surgery). A thin scope with a camera (laparoscope) is inserted through one incision.  Thin surgical instruments are inserted through the other incisions. In some cases, a laparoscopic procedure may be turned into a type of surgery that is done through a larger incision (open surgery). Tell a health care provider about:  Any allergies you have.  All medicines you are taking, including vitamins, herbs, eye drops, creams, and over-the-counter medicines.  Any problems you or family members have had with anesthetic medicines.  Any blood disorders you have.  Any surgeries you have had.  Any medical conditions you have.  Whether you are pregnant or may be pregnant. What are the risks? Generally, this is a safe procedure. However, problems may occur, including:  Infection.  Bleeding.  Allergic reactions to medicines.  Damage to other structures or organs.  A stone remaining in the common bile duct. The common bile duct carries bile from the gallbladder into the small intestine.  A bile leak from the cyst duct that is clipped when  your gallbladder is removed. Medicines  Ask your health care provider about: ? Changing or stopping your regular medicines. This is especially important if you are taking diabetes medicines or blood thinners. ? Taking medicines such as aspirin and ibuprofen. These medicines can thin your blood. Do not take these medicines before your procedure if your health care provider instructs you not to.  You may be given antibiotic medicine to help prevent infection. General instructions  Let your health care provider know if you develop a cold or an infection before surgery.  Plan to have someone take you home from the hospital or clinic.  Ask your health care provider how your surgical site will be marked or identified. What happens during the procedure?   To reduce your risk of infection: ? Your health care team will wash or sanitize their hands. ? Your skin will be washed with soap. ? Hair may be removed from the surgical area.  An IV tube may be inserted into one of your veins.  You will be given one or more of the following: ? A medicine to help you relax (sedative). ? A medicine to make you fall asleep (general anesthetic).  A breathing tube will be placed in your mouth.  Your surgeon will make several small cuts (incisions) in your abdomen.  The laparoscope will be inserted through one of the small incisions. The camera on the laparoscope will send images to a TV screen (monitor) in the operating room. This lets your surgeon see inside your abdomen.  Air-like gas will be pumped into your abdomen. This will expand your abdomen to give the surgeon more room to perform the surgery.  Other tools that are needed for the procedure will be inserted through the other incisions. The gallbladder will be removed through one of the incisions.  Your common bile duct may be examined. If stones are found in the common bile duct, they may be removed.  After your gallbladder has been removed,  the incisions will be closed with stitches (sutures), staples, or skin glue.  Your incisions may be covered with a bandage (dressing). The procedure may vary among health care providers and hospitals. What happens after the procedure?  Your blood pressure, heart rate, breathing rate, and blood oxygen level will be monitored until the medicines you were given have worn off.  You will be given medicines as needed to control your pain.  Do not drive for 24 hours if you were given a sedative. This information is not intended to replace advice given to  you by your health care provider. Make sure you discuss any questions you have with your health care provider. Document Revised: 12/22/2016 Document Reviewed: 06/28/2015 Elsevier Patient Education  2020 [employer]

## 2019-07-29 NOTE — Telephone Encounter (Signed)
This request has received a Favorable outcome.  Please note any additional information provided by [facility] at the bottom of your screen.  You will also receive a faxed copy of the determination.  Pharmacy made aware.

## 2019-07-29 NOTE — Telephone Encounter (Signed)
PA Submitted through [email or web address] and received the following:  [facility] is reviewing your PA request. Typically an electronic response will be received within 72 hours. To check for an update later, open this request from your dashboard.   

## 2019-07-29 NOTE — Progress Notes (Signed)
[facility] History and Physical  Reason for Referral: Gallstones  Referring Physician: [name], PA ED  Chief Complaint    New Patient (Initial Visit)      Rachel Carrillo is a 39 y.o. female.  HPI: Rachel Carrillo is a sweet 39 yo who came to the Ed with complaints of nausea and vomiting and abdominal pain in the RUQ. She also had lower abdominal pain and a history of chronic constipation. She had been to the urgent care a few weeks prior with similar symptoms and actually had not had BM in the 2 weeks since being at the urgent care.  She reports that she was getting set up to see GI as an outpatient.  The ED worked her up with labs and a CT. Her CT demonstrated thickening of the pyloric region concerning for ulcer and gallstones in her gallbladder without signs of cholecystitis.   She says that normally she has the most nausea/vomiting in the mornings after eating as well as the most pain.  She was sent to my office for the question of gallstones.  The ED prescribed carafate which she is taking and protonix. She has not started the protonix. She says the carafate is helping her pain some.   Past Medical History:  Diagnosis Date  . HSV-2 infection   . Migraine     Past Surgical History:  Procedure Laterality Date  . ENDOMETRIAL ABLATION N/A 05/16/2017   Procedure: ENDOMETRIAL ABLATION WITH MINERVA;  Surgeon: [name], MD;  Location: [facility];  Service: Gynecology;  Laterality: N/A;  . HYSTEROSCOPY WITH D & C N/A 05/16/2017   Procedure: DILATATION AND CURETTAGE /HYSTEROSCOPY;  Surgeon: [name], MD;  Location: [facility];  Service: Gynecology;  Laterality: N/A;  . TUBAL LIGATION  02/18/2011   Procedure: POST PARTUM TUBAL LIGATION;  Surgeon: [name], MD;  Location: [facility];  Service: Gynecology;  Laterality: Bilateral;  Post partum tubal ligation bilateral with filshie clips.  . WISDOM TOOTH EXTRACTION      Family History  Problem Relation Age of  Onset  . Hypertension Mother   . Other Mother        TIA  . Stroke Mother   . Irritable bowel syndrome Mother   . Diabetes Maternal Aunt   . Arthritis Maternal Aunt        rheumatoid  . Arthritis Maternal Uncle        rheumatoid  . Arthritis Maternal Grandmother        rheumatoid  . Crohn's disease Maternal Grandfather   . Cancer Maternal Grandfather   . Heart disease Paternal Grandfather   . Other Paternal Grandfather        was on oxygen    Social History   Tobacco Use  . Smoking status: Former Smoker    Packs/day: 0.25    Types: Cigarettes  . Smokeless tobacco: Never Used  Vaping Use  . Vaping Use: Never used  Substance Use Topics  . Alcohol use: No  . Drug use: No    Medications: I have reviewed the patient's current medications. Allergies as of 07/29/2019   No Known Allergies     Medication List       Accurate as of July 29, 2019 11:59 PM. If you have any questions, ask your nurse or doctor.        STOP taking these medications   dicyclomine 20 MG tablet Commonly known as: BENTYL Stopped by: Lucretia Roers, MD  TAKE these medications   pantoprazole 40 MG tablet Commonly known as: PROTONIX Take 1 tablet (40 mg total) by mouth 2 (two) times daily. What changed:   medication strength  how much to take  when to take this Changed by: Anne Hahn, Sandy B, RMA   promethazine 12.5 MG tablet Commonly known as: PHENERGAN Take 1 tablet (12.5 mg total) by mouth every 6 (six) hours as needed for nausea or vomiting.   rizatriptan 10 MG tablet Commonly known as: MAXALT Take 10 mg by mouth as needed for migraine. May repeat in 2 hours if needed   sucralfate 1 g tablet Commonly known as: Carafate Take 1 tablet (1 g total) by mouth 4 (four) times daily -  with meals and at bedtime.        ROS:  A comprehensive review of systems was negative except for: Gastrointestinal: positive for abdominal pain, constipation, nausea, reflux symptoms and  vomiting Musculoskeletal: positive for back pain and neck pain Endocrine: positive for cold intolerance, tired/sluggish  Blood pressure 112/80, pulse 76, temperature (!) 97.3 F (36.3 C), temperature source Temporal, resp. rate 14, height 5\' 4"  (1.626 m), weight 194 lb (88 kg), SpO2 98 %. Physical Exam Vitals reviewed.  Constitutional:      Appearance: Normal appearance.  HENT:     Head: Normocephalic and atraumatic.     Nose: Nose normal.     Mouth/Throat:     Mouth: Mucous membranes are moist.  Eyes:     Extraocular Movements: Extraocular movements intact.     Pupils: Pupils are equal, round, and reactive to light.  Cardiovascular:     Rate and Rhythm: Normal rate and regular rhythm.  Pulmonary:     Effort: Pulmonary effort is normal.     Breath sounds: Normal breath sounds.  Abdominal:     General: There is no distension.     Palpations: Abdomen is soft.     Tenderness: There is abdominal tenderness in the right upper quadrant and epigastric area.  Musculoskeletal:        General: No swelling. Normal range of motion.     Cervical back: Normal range of motion.  Skin:    General: Skin is warm and dry.  Neurological:     General: No focal deficit present.     Mental Status: She is alert and oriented to person, place, and time.  Psychiatric:        Mood and Affect: Mood normal.        Behavior: Behavior normal.        Thought Content: Thought content normal.        Judgment: Judgment normal.     Results: Personally reviewed- CT with gallstones packed in the gallbladder. Slightly thickening at the pylorus?   CLINICAL DATA: Nausea vomiting  EXAM: CT ABDOMEN AND PELVIS WITH CONTRAST  TECHNIQUE: Multidetector CT imaging of the abdomen and pelvis was performed using the standard protocol following bolus administration of intravenous contrast.  CONTRAST: OMNIPAQUE IOHEXOL 300 MG/ML SOLN  COMPARISON: Pelvic ultrasound 05/01/2017  FINDINGS: Lower chest: Lung  bases demonstrate no acute consolidation or effusion. Cardiac size within normal limits.  Hepatobiliary: Multiple gallstones. No focal hepatic abnormality or biliary dilatation.  Pancreas: Unremarkable. No pancreatic ductal dilatation or surrounding inflammatory changes.  Spleen: Borderline enlarged at 13 cm.  Adrenals/Urinary Tract: Adrenal glands are normal. Kidneys show no hydronephrosis. The bladder is normal  Stomach/Bowel: The stomach is nonenlarged. Slightly indistinct appearance of the pylorus. No dilated small bowel.  No bowel wall thickening. Negative appendix.  Vascular/Lymphatic: No significant vascular findings are present. No enlarged abdominal or pelvic lymph nodes.  Reproductive: Uterus unremarkable. Left ligation clip is in place. A second ligation clip is seen along the left uterine body. No adnexal mass.  Other: No abdominal wall hernia or abnormality. No abdominopelvic ascites.  Musculoskeletal: No acute or significant osseous findings.  IMPRESSION: 1. Indistinct slightly thickened appearance of pylorus, question peptic ulcer disease. 2. Gallstones. 3. Status post tubal ligation with displacement of right ligation clip to the left pelvis, adjacent to the body of the uterus.   Electronically Signed By: Jasmine Pang M.D. On: 07/25/2019 18:33  Assessment & Plan:  RHONDALYN CLINGAN is a 39 y.o. female with potential gastritis/ ulcer disease and gallstones. It is difficult to know which is causing the majority of her symptoms or if both are contributing. She is having some improvement with her carafate. She has not started protonix. She also has significant constipation.   -Referral to Gi for possible peptic ulcer disease/ gastritis and constipation  -Protonix 40 Bid written for now, continue carafate -Patient is already modifying diet to avoid fatty foods -Will see back in a few weeks for reevaluation and discussion of laparoscopic cholecystectomy    Future Appointments  Date Time Provider Department Center  08/21/2019 10:30 AM [name], MD [facility] None  08/28/2019  1:30 PM Lucretia Roers, MD [facility] None    All questions were answered to the satisfaction of the patient and family.     Lucretia Roers 08/01/2019, 12:28 PM

## 2019-08-01 ENCOUNTER — Encounter: Payer: Self-pay | Admitting: General Surgery

## 2019-08-06 ENCOUNTER — Telehealth: Payer: Self-pay | Admitting: Family Medicine

## 2019-08-06 NOTE — Telephone Encounter (Signed)
Pt called [facility] stating that she started taking the Protonix bid as directed and started having stomach cramps and diarrhea. She stopped the medication and wanted to just make you aware that she had stopped taking it.

## 2019-08-07 NOTE — Telephone Encounter (Signed)
[facility]  Rachel Carrillo. She has an appt with GI in a few weeks but like we discussed she has might have multiple things going on . We can go ahead and proceed with gallbladder surgery if she wants, but I would recommend she take some form of antiacid over the counter/ pepcid etc given the question of pyloric thickening on her CT.  If she wants to go ahead and schedule surgery, we can schedule it or if she has questions about it she can see me again in clinic.  Algis Greenhouse, MD [facility] [address] [place], [place] [address] [phone] (office)

## 2019-08-11 NOTE — Telephone Encounter (Signed)
Mychart message sent with recommendations.

## 2019-08-21 ENCOUNTER — Encounter (INDEPENDENT_AMBULATORY_CARE_PROVIDER_SITE_OTHER): Payer: Self-pay | Admitting: Gastroenterology

## 2019-08-21 ENCOUNTER — Other Ambulatory Visit: Payer: Self-pay

## 2019-08-21 ENCOUNTER — Encounter (INDEPENDENT_AMBULATORY_CARE_PROVIDER_SITE_OTHER): Payer: Self-pay | Admitting: *Deleted

## 2019-08-21 ENCOUNTER — Ambulatory Visit (INDEPENDENT_AMBULATORY_CARE_PROVIDER_SITE_OTHER): Payer: Commercial Managed Care - PPO | Admitting: Gastroenterology

## 2019-08-21 VITALS — BP 111/77 | HR 82 | Temp 99.1°F | Ht 64.0 in | Wt 187.2 lb

## 2019-08-21 DIAGNOSIS — R112 Nausea with vomiting, unspecified: Secondary | ICD-10-CM | POA: Diagnosis not present

## 2019-08-21 DIAGNOSIS — K5904 Chronic idiopathic constipation: Secondary | ICD-10-CM

## 2019-08-21 DIAGNOSIS — K2100 Gastro-esophageal reflux disease with esophagitis, without bleeding: Secondary | ICD-10-CM | POA: Diagnosis not present

## 2019-08-21 DIAGNOSIS — R933 Abnormal findings on diagnostic imaging of other parts of digestive tract: Secondary | ICD-10-CM | POA: Diagnosis not present

## 2019-08-21 DIAGNOSIS — K5900 Constipation, unspecified: Secondary | ICD-10-CM | POA: Insufficient documentation

## 2019-08-21 NOTE — Progress Notes (Signed)
Katrinka Blazing, M.D. Gastroenterology & Hepatology [facility] [address] [place], [place] [address] Primary Care Physician: Benita Stabile, MD [address] [place] [address]  Referring MD: Leatrice Jewels. Henreitta Leber, MD  I will communicate my assessment and recommendations to the referring MD via EMR. Note: Occasional unusual wording and randomly placed punctuation marks may result from the use of speech recognition technology to transcribe this document"  Chief Complaint: Abdominal cramping and episode of vomiting  History of Present Illness: Rachel Carrillo is a 39 y.o. female with PMH migraines, who presents for evaluation of abdominal cramping and episode of vomiting.  The patient states that she was referred to our clinic after being found to have an abnormal imaging during most recent visit to the ED.  The patient states that during the last few years she has had multiple episodes of intractable vomiting and nausea, along with episodes of cramping in her upper abdomen.  Usually, these episodes have been self-limited.  Her most recent episode was on 07/25/2019.  Due to this she came to the ER, she underwent blood testing that showed normal CBC, lipase of 27, and normal CMP.  She underwent a CT of the abdomen/pelvis with IV contrast which showed presence of any specific thickening of the pylorus.  There was presence of multiple gallstones as well.  The patient was discharged home on pantoprazole 20 mg daily and Carafate 3 times a day, also on promethazine as needed for nausea.  Should follow in the clinic with Dr. Henreitta Leber on 07/29/2019, who considered her symptoms could be related to biliary colic but wanted her to be seen in her clinic before scheduling cholecystectomy.  The patient was given pantoprazole 40 mg twice daily at this visit, but she reports that she had worsening diarrhea and abdominal discomfort so did not take the medication  anymore.  She has only been taking Carafate every 6-8 hours compliantly.  Since she was concerned her symptoms could be related to gallstones, she has changed her diet to a low-fat diet and has lost close to 7 pounds since her most recent visit to the ER.  Notably, the patient reports having heartburn in the past with fried foods but she has not had the symptoms frequently.  Usually takes Tums or Pepto-Bismol which helps for her pain.  She denies having any heartburn episodes when the nausea and vomiting are present.  On the other hand, the patient reports a longstanding history of constipation.  She has had to use every other month a laxative to move her bowels as she used to have a bowel movement after a week.  However, she has noted that since she implemented a low-fat diet she is moving her bowels every 3 days without significant abdominal distention or discomfort.  The patient denies having any fever, chills, hematochezia, melena, hematemesis, abdominal distention, diarrhea, jaundice, pruritus.  Last UJW:JXBJY Last Colonoscopy:never  FHx: grandfather Crohn's disease, neg for other any gastrointestinal/liver disease, no malignancies Social: former smoking until 2013 smoked for four years, neg frequent alcohol or illicit drug use Surgical: Fallopian tubes, endometrial ablation  Past Medical History: Past Medical History:  Diagnosis Date  . HSV-2 infection   . Migraine     Past Surgical History: Past Surgical History:  Procedure Laterality Date  . ENDOMETRIAL ABLATION N/A 05/16/2017   Procedure: ENDOMETRIAL ABLATION WITH MINERVA;  Surgeon: [name], MD;  Location: [facility];  Service: Gynecology;  Laterality: N/A;  .  HYSTEROSCOPY WITH D & C N/A 05/16/2017   Procedure: DILATATION AND CURETTAGE /HYSTEROSCOPY;  Surgeon: [name], MD;  Location: [facility];  Service: Gynecology;  Laterality: N/A;  . TUBAL LIGATION  02/18/2011   Procedure: POST PARTUM TUBAL LIGATION;  Surgeon: [name], MD;  Location: [facility];  Service: Gynecology;  Laterality: Bilateral;  Post partum tubal ligation bilateral with filshie clips.  . WISDOM TOOTH EXTRACTION      Family History: Family History  Problem Relation Age of Onset  . Hypertension Mother   . Other Mother        TIA  . Stroke Mother   . Irritable bowel syndrome Mother   . Diabetes Maternal Aunt   . Arthritis Maternal Aunt        rheumatoid  . Arthritis Maternal Uncle        rheumatoid  . Arthritis Maternal Grandmother        rheumatoid  . Crohn's disease Maternal Grandfather   . Cancer Maternal Grandfather   . Heart disease Paternal Grandfather   . Other Paternal Grandfather        was on oxygen    Social History: Social History   Tobacco Use  Smoking Status Former Smoker  . Packs/day: 0.25  . Types: Cigarettes  Smokeless Tobacco Never Used   Social History   Substance and Sexual Activity  Alcohol Use No   Social History   Substance and Sexual Activity  Drug Use No    Allergies: No Known Allergies  Medications: Current Outpatient Medications  Medication Sig Dispense Refill  . acetaminophen (TYLENOL) 500 MG tablet Take 500 mg by mouth as needed.    . promethazine (PHENERGAN) 12.5 MG tablet Take 1 tablet (12.5 mg total) by mouth every 6 (six) hours as needed for nausea or vomiting. 16 tablet 0  . rizatriptan (MAXALT) 10 MG tablet Take 10 mg by mouth as needed for migraine. May repeat in 2 hours if needed    . sucralfate (CARAFATE) 1 g tablet Take 1 tablet (1 g total) by mouth 4 (four) times daily -  with meals and at bedtime. 120 tablet 0   No current facility-administered medications for this visit.    Review of Systems: GENERAL: negative for malaise, night sweats HEENT: No changes in hearing or vision, no nose bleeds or other nasal problems. NECK: Negative for lumps, goiter, pain and significant neck swelling RESPIRATORY: Negative for cough, wheezing CARDIOVASCULAR: Negative for chest  pain, leg swelling, palpitations, orthopnea GI: SEE HPI MUSCULOSKELETAL: Negative for joint pain or swelling, back pain, and muscle pain. SKIN: Negative for lesions, rash PSYCH: Negative for sleep disturbance, mood disorder and recent psychosocial stressors. HEMATOLOGY Negative for prolonged bleeding, bruising easily, and swollen nodes. ENDOCRINE: Negative for cold or heat intolerance, polyuria, polydipsia and goiter. NEURO: negative for tremor, gait imbalance, syncope and seizures. The remainder of the review of systems is noncontributory.   Physical Exam: BP 111/77 (BP Location: Right Arm, Patient Position: Sitting, Cuff Size: Normal)   Pulse 82   Temp 99.1 F (37.3 C) (Oral)   Ht 5\' 4"  (1.626 m)   Wt 187 lb 3.2 oz (84.9 kg)   BMI 32.13 kg/m  GENERAL: The patient is AO x3, in no acute distress. HEENT: Head is normocephalic and atraumatic. EOMI are intact. Mouth is well hydrated and without lesions. NECK: Supple. No masses LUNGS: Clear to auscultation. No presence of rhonchi/wheezing/rales. Adequate chest expansion HEART: RRR, normal s1 and s2. ABDOMEN: Soft, nontender,  no guarding, no peritoneal signs, and nondistended. BS +. No masses. EXTREMITIES: Without any cyanosis, clubbing, rash, lesions or edema. NEUROLOGIC: AOx3, no focal motor deficit. SKIN: no jaundice, no rashes  Imaging/Labs: as above  I personally reviewed and interpreted the available labs, imaging and endoscopic files.  Impression and Plan: Rachel Carrillo is a 39 y.o. female with PMH migraines, who presents for evaluation of abdominal cramping and episode of vomiting.  Her recurrent symptoms of vomiting and upper abdominal cramping could be related to biliary colic, for which she is undergoing evaluation for cholecystectomy by general surgery.  However, other etiologies of abdominal pain such as gastritis or peptic ulcer disease will need to be evaluated in the light of abnormalities seen in most recent CT  scan.  I considered it is less likely that these abnormalities represent an organic pathology as she has worsening symptoms while on PPI.  The alterations could also be related to transient pyloric contraction. The patient has not presented any red flag signs, which make malignancy less likely.  At the moment, we will proceed with an EGD for further evaluation of her upper gastrointestinal tract.  Finally, regarding her longstanding constipation, she was counseled to continue her low-fat diet and to implement the intake of prunes/kiwi.  There is no need to start any laxatives at this point.  More than 50% of the office visit was dedicated to discussing the procedure, including the day of and risks involved. Patient understands what the procedure involves including the benefits and any risks. Patient understands alternatives to the proposed procedure. Risks including (but not limited to) bleeding, tearing of the lining (perforation), rupture of adjacent organs, problems with heart and lung function, infection, and medication reactions. A small percentage of complications may require surgery, hospitalization, repeat endoscopic procedure, and/or transfusion. A small percentage of polyps and other tumors may not be seen.  - Schedule EGD - Take prune juice and eat kiwi daily  All questions were answered.      [name], MD Gastroenterology and Hepatology [facility]

## 2019-08-21 NOTE — Patient Instructions (Addendum)
Schedule EGD Take prune juice and eat kiwi daily

## 2019-08-25 ENCOUNTER — Other Ambulatory Visit (INDEPENDENT_AMBULATORY_CARE_PROVIDER_SITE_OTHER): Payer: Self-pay | Admitting: *Deleted

## 2019-08-25 ENCOUNTER — Encounter (INDEPENDENT_AMBULATORY_CARE_PROVIDER_SITE_OTHER): Payer: Self-pay

## 2019-08-27 ENCOUNTER — Encounter (HOSPITAL_COMMUNITY): Payer: Self-pay

## 2019-08-27 ENCOUNTER — Encounter (HOSPITAL_COMMUNITY)
Admission: RE | Admit: 2019-08-27 | Discharge: 2019-08-27 | Disposition: A | Discharge status: Home / Self Care | Payer: Commercial Managed Care - PPO | Source: Ambulatory Visit | Attending: Gastroenterology | Admitting: Gastroenterology

## 2019-08-27 ENCOUNTER — Other Ambulatory Visit (HOSPITAL_COMMUNITY)
Admission: RE | Admit: 2019-08-27 | Discharge: 2019-08-27 | Disposition: A | Discharge status: Home / Self Care | Payer: Commercial Managed Care - PPO | Source: Ambulatory Visit | Attending: Gastroenterology | Admitting: Gastroenterology

## 2019-08-27 ENCOUNTER — Other Ambulatory Visit: Payer: Self-pay

## 2019-08-27 DIAGNOSIS — Z20822 Contact with and (suspected) exposure to covid-19: Secondary | ICD-10-CM | POA: Diagnosis not present

## 2019-08-27 DIAGNOSIS — Z01812 Encounter for preprocedural laboratory examination: Secondary | ICD-10-CM | POA: Diagnosis present

## 2019-08-27 LAB — SARS CORONAVIRUS 2 (TAT 6-24 HRS): SARS Coronavirus 2: NEGATIVE

## 2019-08-27 LAB — PREGNANCY, URINE: Preg Test, Ur: NEGATIVE

## 2019-08-27 NOTE — Pre-Procedure Instructions (Signed)
Patient in for PAT. She is scheduled for EGD 08/29/2019 and Lap choley on 09/03/2019. Printed and went over instructions for both procedures while she was here. She verbalized understanding of all of this.

## 2019-08-27 NOTE — Patient Instructions (Signed)
Rachel Carrillo  08/27/2019     @PREFPERIOPPHARMACY @   Your Rachel Ihaprocedure is scheduled on  09/03/2019.  [facility] to [name] at  0825  A.M.  Call this number if you have problems the morning of surgery:  [phone]   Remember:  Do not eat or drink after midnight.                       Take these medicines the morning of surgery with A SIP OF WATER : phenergan and maxalt if needed.    Do not wear jewelry, make-up or nail polish.  Do not wear lotions, powders, or perfumes. Please wear deodorant and brush your teeth.  Do not shave 48 hours prior to surgery.  Men may shave face and neck.  Do not bring valuables to the hospital.  [facility] any belongings or valuables.  Contacts, dentures or bridgework may not be worn into surgery.  Leave your suitcase in the car.  After surgery it may be brought to your room.  For patients admitted to the hospital, discharge time will be determined by your treatment team.  Patients discharged the day of surgery will not be allowed to drive home.   Name and phone number of your driver:   family Special instructions:  DO NOT smoke the morning of your procedures.  Please read over the following fact sheets that you were given. Anesthesia Post-op Instructions and Care and Recovery After Surgery    Rachel IhaJennifer I Carrillo  08/27/2019     @PREFPERIOPPHARMACY @   Your procedure is scheduled on  08/29/2019   [facility] to [name] at  1115 A.M.  Call this number if you have problems the morning of surgery:  [phone]   Remember:  Follow the diet instructions given to you by Dr Wilburt Finlayastaneda's office.                      Take these medicines the morning of surgery with A SIP OF WATER  Phenergan and maxalt (if needed).    Do not wear jewelry, make-up or nail polish.  Do not wear lotions, powders, or perfumes. Please wear deodorant and brush your teeth.  Do not shave 48 hours prior to surgery.  Men may shave face and  neck.  Do not bring valuables to the hospital.  [facility] any belongings or valuables.  Contacts, dentures or bridgework may not be worn into surgery.  Leave your suitcase in the car.  After surgery it may be brought to your room.  For patients admitted to the hospital, discharge time will be determined by your treatment team.  Patients discharged the day of surgery will not be allowed to drive home.   Name and phone number of your driver:   family Special instructions:  DO NOT smoke the morning of your procedure.  Please read over the following fact sheets that you were given. Anesthesia Post-op Instructions and Care and Recovery After Surgery     Upper Endoscopy, Adult, Care After This sheet gives you information about how to care for yourself after your procedure. Your health care provider may also give you more specific instructions. If you have problems or questions, contact your health care provider. What can I expect after the procedure? After the procedure, it is common to have:  A sore throat.  Mild stomach pain or discomfort.  Bloating.  Nausea. Follow these instructions at home:   Follow instructions from your health care provider about what to eat or drink after your procedure.  Return to your normal activities as told by your health care provider. Ask your health care provider what activities are safe for you.  Take over-the-counter and prescription medicines only as told by your health care provider.  Do not drive for 24 hours if you were given a sedative during your procedure.  Keep all follow-up visits as told by your health care provider. This is important. Contact a health care provider if you have:  A sore throat that lasts longer than one day.  Trouble swallowing. Get help right away if:  You vomit blood or your vomit looks like coffee grounds.  You have: ? A fever. ? Bloody, black, or tarry stools. ? A severe sore  throat or you cannot swallow. ? Difficulty breathing. ? Severe pain in your chest or abdomen. Summary  After the procedure, it is common to have a sore throat, mild stomach discomfort, bloating, and nausea.  Do not drive for 24 hours if you were given a sedative during the procedure.  Follow instructions from your health care provider about what to eat or drink after your procedure.  Return to your normal activities as told by your health care provider. This information is not intended to replace advice given to you by your health care provider. Make sure you discuss any questions you have with your health care provider. Document Revised: 07/03/2017 Document Reviewed: 06/11/2017 Elsevier Patient Education  2020 [employer] Inc[employer]            Laparoscopic Cholecystectomy, Care After This sheet gives you information about how to care for yourself after your procedure. Your health care provider may also give you more specific instructions. If you have problems or questions, contact your health care provider. What can I expect after the procedure? After the procedure, it is common to have:  Pain at your incision sites. You will be given medicines to control this pain.  Mild nausea or vomiting.  Bloating and possible shoulder pain from the air-like gas that was used during the procedure. Follow these instructions at home: Incision care   Follow instructions from your health care provider about how to take care of your incisions. Make sure you: ? Wash your hands with soap and water before you change your bandage (dressing). If soap and water are not available, use hand sanitizer. ? Change your dressing as told by your health care provider. ? Leave stitches (sutures), skin glue, or adhesive strips in place. These skin closures may need to be in place for 2 weeks or longer. If adhesive strip edges start to loosen and curl up, you may trim the loose edges. Do not remove adhesive strips  completely unless your health care provider tells you to do that.  Do not take baths, swim, or use a hot tub until your health care provider approves. Ask your health care provider if you can take showers. You may only be allowed to take sponge baths for bathing.  Check your incision area every day for signs of infection. Check for: ? More redness, swelling, or pain. ? More fluid or blood. ? Warmth. ? Pus or a bad smell. Activity  Do not drive or use heavy machinery while taking prescription pain medicine.  Do not lift anything that is heavier than 10 lb (4.5 kg) until your health care provider approves.  Do not  play contact sports until your health care provider approves.  Do not drive for 24 hours if you were given a medicine to help you relax (sedative).  Rest as needed. Do not return to work or school until your health care provider approves. General instructions  Take over-the-counter and prescription medicines only as told by your health care provider.  To prevent or treat constipation while you are taking prescription pain medicine, your health care provider may recommend that you: ? Drink enough fluid to keep your urine clear or pale yellow. ? Take over-the-counter or prescription medicines. ? Eat foods that are high in fiber, such as fresh fruits and vegetables, whole grains, and beans. ? Limit foods that are high in fat and processed sugars, such as fried and sweet foods. Contact a health care provider if:  You develop a rash.  You have more redness, swelling, or pain around your incisions.  You have more fluid or blood coming from your incisions.  Your incisions feel warm to the touch.  You have pus or a bad smell coming from your incisions.  You have a fever.  One or more of your incisions breaks open. Get help right away if:  You have trouble breathing.  You have chest pain.  You have increasing pain in your shoulders.  You faint or feel dizzy when you  stand.  You have severe pain in your abdomen.  You have nausea or vomiting that lasts for more than one day.  You have leg pain. This information is not intended to replace advice given to you by your health care provider. Make sure you discuss any questions you have with your health care provider. Document Revised: 12/22/2016 Document Reviewed: 06/28/2015 Elsevier Patient Education  2020 Else[employer] Inc.  General Anesthesia, Adult, Care After This sheet gives you information about how to care for yourself after your procedure. Your health care provider may also give you more specific instructions. If you have problems or questions, contact your health care provider. What can I expect after the procedure? After the procedure, the following side effects are common:  Pain or discomfort at the IV site.  Nausea.  Vomiting.  Sore throat.  Trouble concentrating.  Feeling cold or chills.  Weak or tired.  Sleepiness and fatigue.  Soreness and body aches. These side effects can affect parts of the body that were not involved in surgery. Follow these instructions at home:  For at least 24 hours after the procedure:  Have a responsible adult stay with you. It is important to have someone help care for you until you are awake and alert.  Rest as needed.  Do not: ? Participate in activities in which you could fall or become injured. ? Drive. ? Use heavy machinery. ? Drink alcohol. ? Take sleeping pills or medicines that cause drowsiness. ? Make important decisions or sign legal documents. ? Take care of children on your own. Eating and drinking  Follow any instructions from your health care provider about eating or drinking restrictions.  When you feel hungry, start by eating small amounts of foods that are soft and easy to digest (bland), such as toast. Gradually return to your regular diet.  Drink enough fluid to keep your urine pale yellow.  If you vomit, rehydrate by  drinking water, juice, or clear broth. General instructions  If you have sleep apnea, surgery and certain medicines can increase your risk for breathing problems. Follow instructions from your health care provider about wearing your  sleep device: ? Anytime you are sleeping, including during daytime naps. ? While taking prescription pain medicines, sleeping medicines, or medicines that make you drowsy.  Return to your normal activities as told by your health care provider. Ask your health care provider what activities are safe for you.  Take over-the-counter and prescription medicines only as told by your health care provider.  If you smoke, do not smoke without supervision.  Keep all follow-up visits as told by your health care provider. This is important. Contact a health care provider if:  You have nausea or vomiting that does not get better with medicine.  You cannot eat or drink without vomiting.  You have pain that does not get better with medicine.  You are unable to pass urine.  You develop a skin rash.  You have a fever.  You have redness around your IV site that gets worse. Get help right away if:  You have difficulty breathing.  You have chest pain.  You have blood in your urine or stool, or you vomit blood. Summary  After the procedure, it is common to have a sore throat or nausea. It is also common to feel tired.  Have a responsible adult stay with you for the first 24 hours after general anesthesia. It is important to have someone help care for you until you are awake and alert.  When you feel hungry, start by eating small amounts of foods that are soft and easy to digest (bland), such as toast. Gradually return to your regular diet.  Drink enough fluid to keep your urine pale yellow.  Return to your normal activities as told by your health care provider. Ask your health care provider what activities are safe for you. This information is not intended to  replace advice given to you by your health care provider. Make sure you discuss any questions you have with your health care provider. Document Revised: 01/12/2017 Document Reviewed: 08/25/2016 Elsevier Patient Education  2020 [employer]. How to Use Chlorhexidine for Bathing Chlorhexidine gluconate (CHG) is a germ-killing (antiseptic) solution that is used to clean the skin. It can get rid of the bacteria that normally live on the skin and can keep them away for about 24 hours. To clean your skin with CHG, you may be given:  A CHG solution to use in the shower or as part of a sponge bath.  A prepackaged cloth that contains CHG. Cleaning your skin with CHG may help lower the risk for infection:  While you are staying in the intensive care unit of the hospital.  If you have a vascular access, such as a central line, to provide short-term or long-term access to your veins.  If you have a catheter to drain urine from your bladder.  If you are on a ventilator. A ventilator is a machine that helps you breathe by moving air in and out of your lungs.  After surgery. What are the risks? Risks of using CHG include:  A skin reaction.  Hearing loss, if CHG gets in your ears.  Eye injury, if CHG gets in your eyes and is not rinsed out.  The CHG product catching fire. Make sure that you avoid smoking and flames after applying CHG to your skin. Do not use CHG:  If you have a chlorhexidine allergy or have previously reacted to chlorhexidine.  On babies younger than 30 months of age. How to use CHG solution  Use CHG only as told by your health  care provider, and follow the instructions on the label.  Use the full amount of CHG as directed. Usually, this is one bottle. During a shower Follow these steps when using CHG solution during a shower (unless your health care provider gives you different instructions): 1. Start the shower. 2. Use your normal soap and shampoo to wash your face and  hair. 3. Turn off the shower or move out of the shower stream. 4. Pour the CHG onto a clean washcloth. Do not use any type of brush or rough-edged sponge. 5. Starting at your neck, lather your body down to your toes. Make sure you follow these instructions: ? If you will be having surgery, pay special attention to the part of your body where you will be having surgery. Scrub this area for at least 1 minute. ? Do not use CHG on your head or face. If the solution gets into your ears or eyes, rinse them well with water. ? Avoid your genital area. ? Avoid any areas of skin that have broken skin, cuts, or scrapes. ? Scrub your back and under your arms. Make sure to wash skin folds. 6. Let the lather sit on your skin for 1-2 minutes or as long as told by your health care provider. 7. Thoroughly rinse your entire body in the shower. Make sure that all body creases and crevices are rinsed well. 8. Dry off with a clean towel. Do not put any substances on your body afterward--such as powder, lotion, or perfume--unless you are told to do so by your health care provider. Only use lotions that are recommended by the manufacturer. 9. Put on clean clothes or pajamas. 10. If it is the night before your surgery, sleep in clean sheets.  During a sponge bath Follow these steps when using CHG solution during a sponge bath (unless your health care provider gives you different instructions): 1. Use your normal soap and shampoo to wash your face and hair. 2. Pour the CHG onto a clean washcloth. 3. Starting at your neck, lather your body down to your toes. Make sure you follow these instructions: ? If you will be having surgery, pay special attention to the part of your body where you will be having surgery. Scrub this area for at least 1 minute. ? Do not use CHG on your head or face. If the solution gets into your ears or eyes, rinse them well with water. ? Avoid your genital area. ? Avoid any areas of skin that  have broken skin, cuts, or scrapes. ? Scrub your back and under your arms. Make sure to wash skin folds. 4. Let the lather sit on your skin for 1-2 minutes or as long as told by your health care provider. 5. Using a different clean, wet washcloth, thoroughly rinse your entire body. Make sure that all body creases and crevices are rinsed well. 6. Dry off with a clean towel. Do not put any substances on your body afterward--such as powder, lotion, or perfume--unless you are told to do so by your health care provider. Only use lotions that are recommended by the manufacturer. 7. Put on clean clothes or pajamas. 8. If it is the night before your surgery, sleep in clean sheets. How to use CHG prepackaged cloths  Only use CHG cloths as told by your health care provider, and follow the instructions on the label.  Use the CHG cloth on clean, dry skin.  Do not use the CHG cloth on your head or  face unless your health care provider tells you to.  When washing with the CHG cloth: ? Avoid your genital area. ? Avoid any areas of skin that have broken skin, cuts, or scrapes. Before surgery Follow these steps when using a CHG cloth to clean before surgery (unless your health care provider gives you different instructions): 1. Using the CHG cloth, vigorously scrub the part of your body where you will be having surgery. Scrub using a back-and-forth motion for 3 minutes. The area on your body should be completely wet with CHG when you are done scrubbing. 2. Do not rinse. Discard the cloth and let the area air-dry. Do not put any substances on the area afterward, such as powder, lotion, or perfume. 3. Put on clean clothes or pajamas. 4. If it is the night before your surgery, sleep in clean sheets.  For general bathing Follow these steps when using CHG cloths for general bathing (unless your health care provider gives you different instructions). 1. Use a separate CHG cloth for each area of your body. Make  sure you wash between any folds of skin and between your fingers and toes. Wash your body in the following order, switching to a new cloth after each step: ? The front of your neck, shoulders, and chest. ? Both of your arms, under your arms, and your hands. ? Your stomach and groin area, avoiding the genitals. ? Your right leg and foot. ? Your left leg and foot. ? The back of your neck, your back, and your buttocks. 2. Do not rinse. Discard the cloth and let the area air-dry. Do not put any substances on your body afterward--such as powder, lotion, or perfume--unless you are told to do so by your health care provider. Only use lotions that are recommended by the manufacturer. 3. Put on clean clothes or pajamas. Contact a health care provider if:  Your skin gets irritated after scrubbing.  You have questions about using your solution or cloth. Get help right away if:  Your eyes become very red or swollen.  Your eyes itch badly.  Your skin itches badly and is red or swollen.  Your hearing changes.  You have trouble seeing.  You have swelling or tingling in your mouth or throat.  You have trouble breathing.  You swallow any chlorhexidine. Summary  Chlorhexidine gluconate (CHG) is a germ-killing (antiseptic) solution that is used to clean the skin. Cleaning your skin with CHG may help to lower your risk for infection.  You may be given CHG to use for bathing. It may be in a bottle or in a prepackaged cloth to use on your skin. Carefully follow your health care provider's instructions and the instructions on the product label.  Do not use CHG if you have a chlorhexidine allergy.  Contact your health care provider if your skin gets irritated after scrubbing. This information is not intended to replace advice given to you by your health care provider. Make sure you discuss any questions you have with your health care provider. Document Revised: 03/28/2018 Document Reviewed:  12/07/2016 Elsevier Patient Education  2020 [employer].

## 2019-08-27 NOTE — Progress Notes (Addendum)
Rachel Carrillo  Covid Testing  To whom it mat concern please excuse              From work          Due to                     Was tested for COVID and she needs to stay quarntined till after, her procedure.  Sincerely, Dewaun Kinzler    Waynard Edwards

## 2019-08-28 ENCOUNTER — Ambulatory Visit: Payer: Commercial Managed Care - PPO | Admitting: General Surgery

## 2019-08-28 ENCOUNTER — Other Ambulatory Visit (INDEPENDENT_AMBULATORY_CARE_PROVIDER_SITE_OTHER): Payer: Self-pay | Admitting: Gastroenterology

## 2019-08-28 ENCOUNTER — Encounter: Payer: Self-pay | Admitting: General Surgery

## 2019-08-28 ENCOUNTER — Ambulatory Visit (INDEPENDENT_AMBULATORY_CARE_PROVIDER_SITE_OTHER): Payer: Commercial Managed Care - PPO | Admitting: General Surgery

## 2019-08-28 VITALS — BP 101/70 | HR 63 | Temp 97.0°F | Resp 14 | Ht 64.0 in | Wt 186.0 lb

## 2019-08-28 DIAGNOSIS — K279 Peptic ulcer, site unspecified, unspecified as acute or chronic, without hemorrhage or perforation: Secondary | ICD-10-CM | POA: Diagnosis not present

## 2019-08-28 DIAGNOSIS — K8020 Calculus of gallbladder without cholecystitis without obstruction: Secondary | ICD-10-CM | POA: Diagnosis not present

## 2019-08-28 MED ORDER — SUCRALFATE 1 G PO TABS
1.0000 g | ORAL_TABLET | Freq: Three times a day (TID) | ORAL | 1 refills | Status: DC
Start: 2019-08-28 — End: 2022-01-03

## 2019-08-28 NOTE — Patient Instructions (Signed)
Laparoscopic Cholecystectomy Laparoscopic cholecystectomy is surgery to remove the gallbladder. The gallbladder is a pear-shaped organ that lies beneath the liver on the right side of the body. The gallbladder stores bile, which is a fluid that helps the body to digest fats. Cholecystectomy is often done for inflammation of the gallbladder (cholecystitis). This condition is usually caused by a buildup of gallstones (cholelithiasis) in the gallbladder. Gallstones can block the flow of bile, which can result in inflammation and pain. In severe cases, emergency surgery may be required. This procedure is done though small incisions in your abdomen (laparoscopic surgery). A thin scope with a camera (laparoscope) is inserted through one incision. Thin surgical instruments are inserted through the other incisions. In some cases, a laparoscopic procedure may be turned into a type of surgery that is done through a larger incision (open surgery). Tell a health care provider about:  Any allergies you have.  All medicines you are taking, including vitamins, herbs, eye drops, creams, and over-the-counter medicines.  Any problems you or family members have had with anesthetic medicines.  Any blood disorders you have.  Any surgeries you have had.  Any medical conditions you have.  Whether you are pregnant or may be pregnant. What are the risks? Generally, this is a safe procedure. However, problems may occur, including:  Infection.  Bleeding.  Allergic reactions to medicines.  Damage to other structures or organs.  A stone remaining in the common bile duct. The common bile duct carries bile from the gallbladder into the small intestine.  A bile leak from the cyst duct that is clipped when your gallbladder is removed. Medicines  Ask your health care provider about: ? Changing or stopping your regular medicines. This is especially important if you are taking diabetes medicines or blood  thinners. ? Taking medicines such as aspirin and ibuprofen. These medicines can thin your blood. Do not take these medicines before your procedure if your health care provider instructs you not to.  You may be given antibiotic medicine to help prevent infection. General instructions  Let your health care provider know if you develop a cold or an infection before surgery.  Plan to have someone take you home from the hospital or clinic.  Ask your health care provider how your surgical site will be marked or identified. What happens during the procedure?   To reduce your risk of infection: ? Your health care team will wash or sanitize their hands. ? Your skin will be washed with soap. ? Hair may be removed from the surgical area.  An IV tube may be inserted into one of your veins.  You will be given one or more of the following: ? A medicine to help you relax (sedative). ? A medicine to make you fall asleep (general anesthetic).  A breathing tube will be placed in your mouth.  Your surgeon will make several small cuts (incisions) in your abdomen.  The laparoscope will be inserted through one of the small incisions. The camera on the laparoscope will send images to a TV screen (monitor) in the operating room. This lets your surgeon see inside your abdomen.  Air-like gas will be pumped into your abdomen. This will expand your abdomen to give the surgeon more room to perform the surgery.  Other tools that are needed for the procedure will be inserted through the other incisions. The gallbladder will be removed through one of the incisions.  Your common bile duct may be examined. If stones are found   in the common bile duct, they may be removed.  After your gallbladder has been removed, the incisions will be closed with stitches (sutures), staples, or skin glue.  Your incisions may be covered with a bandage (dressing). The procedure may vary among health care providers and  hospitals. What happens after the procedure?  Your blood pressure, heart rate, breathing rate, and blood oxygen level will be monitored until the medicines you were given have worn off.  You will be given medicines as needed to control your pain.  Do not drive for 24 hours if you were given a sedative. This information is not intended to replace advice given to you by your health care provider. Make sure you discuss any questions you have with your health care provider. Document Revised: 12/22/2016 Document Reviewed: 06/28/2015 Elsevier Patient Education  2020 Elsevier Inc.  

## 2019-08-28 NOTE — Progress Notes (Signed)
[facility] History and Physical   Chief Complaint    Follow-up      Rachel Carrillo is a 39 y.o. female.  HPI:  Rachel Carrillo is a 39 yo known to me who comes in with nausea, vomiting and abdominal pain in the epigastric and RUQ. She had been getting better with PPI and carafate and was referred to GI. She still having pain in the RUQ with meals, and wants to go ahead and plan for cholecystectomy. She has constipation at baseline.  She is scheduled for an EGD tomorrow. Overall she says she is somewhat better but greasy and fatty goods cause her the most problems.    Past Medical History:  Diagnosis Date  . HSV-2 infection   . Migraine     Past Surgical History:  Procedure Laterality Date  . ENDOMETRIAL ABLATION N/A 05/16/2017   Procedure: ENDOMETRIAL ABLATION WITH MINERVA;  Surgeon: [name], MD;  Location: [facility];  Service: Gynecology;  Laterality: N/A;  . HYSTEROSCOPY WITH D & C N/A 05/16/2017   Procedure: DILATATION AND CURETTAGE /HYSTEROSCOPY;  Surgeon: [name], MD;  Location: [facility];  Service: Gynecology;  Laterality: N/A;  . TUBAL LIGATION  02/18/2011   Procedure: POST PARTUM TUBAL LIGATION;  Surgeon: [name], MD;  Location: [facility];  Service: Gynecology;  Laterality: Bilateral;  Post partum tubal ligation bilateral with filshie clips.  . WISDOM TOOTH EXTRACTION      Family History  Problem Relation Age of Onset  . Hypertension Mother   . Other Mother        TIA  . Stroke Mother   . Irritable bowel syndrome Mother   . Diabetes Maternal Aunt   . Arthritis Maternal Aunt        rheumatoid  . Arthritis Maternal Uncle        rheumatoid  . Arthritis Maternal Grandmother        rheumatoid  . Crohn's disease Maternal Grandfather   . Cancer Maternal Grandfather   . Heart disease Paternal Grandfather   . Other Paternal Grandfather        was on oxygen    Social History   Tobacco Use  . Smoking status: Former Smoker    Packs/day: 0.25     Years: 4.00    Pack years: 1.00    Types: Cigarettes    Quit date: 08/27/2011    Years since quitting: 8.0  . Smokeless tobacco: Never Used  Vaping Use  . Vaping Use: Never used  Substance Use Topics  . Alcohol use: No  . Drug use: No    Medications: I have reviewed the patient's current medications. Allergies as of 08/28/2019   No Known Allergies     Medication List       Accurate as of August 28, 2019 10:49 AM. If you have any questions, ask your nurse or doctor.        STOP taking these medications   acetaminophen 500 MG tablet Commonly known as: TYLENOL Stopped by: Lucretia Roers, MD   promethazine 12.5 MG tablet Commonly known as: PHENERGAN Stopped by: Lucretia Roers, MD   rizatriptan 10 MG tablet Commonly known as: MAXALT Stopped by: Lucretia Roers, MD   sucralfate 1 g tablet Commonly known as: Carafate Stopped by: Lucretia Roers, MD        ROS:  A comprehensive review of systems was negative except for: Gastrointestinal: positive for abdominal pain, constipation, nausea and vomiting  Blood pressure 101/70, pulse 63, temperature (!) 97 F (36.1 C), temperature source Oral, resp. rate 14, height 5\' 4"  (1.626 m), weight 186 lb (84.4 kg), SpO2 97 %. Physical Exam Vitals reviewed.  Constitutional:      Appearance: Normal appearance.  HENT:     Head: Normocephalic.     Nose: Nose normal.  Eyes:     Pupils: Pupils are equal, round, and reactive to light.  Cardiovascular:     Rate and Rhythm: Normal rate.  Pulmonary:     Effort: Pulmonary effort is normal.     Breath sounds: Normal breath sounds.  Abdominal:     General: There is no distension.     Palpations: Abdomen is soft.     Tenderness: There is abdominal tenderness in the right upper quadrant and epigastric area.  Musculoskeletal:        General: No swelling. Normal range of motion.     Cervical back: Normal range of motion.  Skin:    General: Skin is warm and dry.    Neurological:     General: No focal deficit present.     Mental Status: She is alert and oriented to person, place, and time.  Psychiatric:        Mood and Affect: Mood normal.        Behavior: Behavior normal.        Thought Content: Thought content normal.        Judgment: Judgment normal.     Results: Results for orders placed or performed during the hospital encounter of 08/27/19 (from the past 48 hour(s))  SARS CORONAVIRUS 2 (TAT 6-24 HRS) Nasopharyngeal Nasopharyngeal Swab     Status: None   Collection Time: 08/27/19  9:19 AM   Specimen: Nasopharyngeal Swab  Result Value Ref Range   SARS Coronavirus 2 NEGATIVE NEGATIVE    Comment: (NOTE) SARS-CoV-2 target nucleic acids are NOT DETECTED.  The SARS-CoV-2 RNA is generally detectable in upper and lower respiratory specimens during the acute phase of infection. Negative results do not preclude SARS-CoV-2 infection, do not rule out co-infections with other pathogens, and should not be used as the sole basis for treatment or other patient management decisions. Negative results must be combined with clinical observations, patient history, and epidemiological information. The expected result is Negative.  Fact Sheet for Patients: 10/27/19  Fact Sheet for Healthcare Providers: HairSlick.no  This test is not yet approved or cleared by the quierodirigir.com FDA and  has been authorized for detection and/or diagnosis of SARS-CoV-2 by FDA under an Emergency Use Authorization (EUA). This EUA will remain  in effect (meaning this test can be used) for the duration of the COVID-19 declaration under Se ction 564(b)(1) of the Act, 21 U.S.C. section 360bbb-3(b)(1), unless the authorization is terminated or revoked sooner.  Performed at [facility], [address] [place], [place]   Pregnancy, urine     Status: None   Collection Time: 08/27/19  9:30 AM   Result Value Ref Range   Preg Test, Ur NEGATIVE NEGATIVE    Comment:        THE SENSITIVITY OF THIS METHODOLOGY IS >20 mIU/mL. Performed at [facility], [address], [place], [place]     CLINICAL DATA:  Nausea vomiting  EXAM: CT ABDOMEN AND PELVIS WITH CONTRAST  TECHNIQUE: Multidetector CT imaging of the abdomen and pelvis was performed using the standard protocol following bolus administration of intravenous contrast.  CONTRAST:  40973 OMNIPAQUE  IOHEXOL 300 MG/ML  SOLN  COMPARISON:  Pelvic ultrasound 05/01/2017  FINDINGS: Lower chest: Lung bases demonstrate no acute consolidation or effusion. Cardiac size within normal limits.  Hepatobiliary: Multiple gallstones. No focal hepatic abnormality or biliary dilatation.  Pancreas: Unremarkable. No pancreatic ductal dilatation or surrounding inflammatory changes.  Spleen: Borderline enlarged at 13 cm.  Adrenals/Urinary Tract: Adrenal glands are normal. Kidneys show no hydronephrosis. The bladder is normal  Stomach/Bowel: The stomach is nonenlarged. Slightly indistinct appearance of the pylorus. No dilated small bowel. No bowel wall thickening. Negative appendix.  Vascular/Lymphatic: No significant vascular findings are present. No enlarged abdominal or pelvic lymph nodes.  Reproductive: Uterus unremarkable. Left ligation clip is in place. A second ligation clip is seen along the left uterine body. No adnexal mass.  Other: No abdominal wall hernia or abnormality. No abdominopelvic ascites.  Musculoskeletal: No acute or significant osseous findings.  IMPRESSION: 1. Indistinct slightly thickened appearance of pylorus, question peptic ulcer disease. 2. Gallstones. 3. Status post tubal ligation with displacement of right ligation clip to the left pelvis, adjacent to the body of the uterus.   Electronically Signed   By: Jasmine Pang M.D.   On: 07/25/2019 18:33   Assessment &  Plan:  Rachel Carrillo is a 39 y.o. female with symptomatic cholelithiasis and potentially some gastritis/ ulcer disease. She is seeing GI.   -PLAN: I counseled the patient about the indication, risks and benefits of laparoscopic cholecystectomy.  She understands there is a very small chance for bleeding, infection, injury to normal structures (including common bile duct), conversion to open surgery, persistent symptoms, evolution of postcholecystectomy diarrhea, need for secondary interventions, anesthesia reaction, cardiopulmonary issues and other risks not specifically detailed here. I described the expected recovery, the plan for follow-up and the restrictions during the recovery phase.  All questions were answered.  Discussed preop COVID testing.  All questions were answered to the satisfaction of the patient and family.    Lucretia Roers 08/28/2019, 10:49 AM

## 2019-08-29 ENCOUNTER — Encounter (HOSPITAL_COMMUNITY)
Admission: RE | Disposition: A | Discharge status: Home / Self Care | Payer: Self-pay | Source: Home / Self Care | Attending: Gastroenterology

## 2019-08-29 ENCOUNTER — Ambulatory Visit (HOSPITAL_COMMUNITY): Payer: Commercial Managed Care - PPO | Admitting: Anesthesiology

## 2019-08-29 ENCOUNTER — Ambulatory Visit (HOSPITAL_COMMUNITY)
Admission: RE | Admit: 2019-08-29 | Discharge: 2019-08-29 | Disposition: A | Discharge status: Home / Self Care | Payer: Commercial Managed Care - PPO | Attending: Gastroenterology | Admitting: Gastroenterology

## 2019-08-29 DIAGNOSIS — R1110 Vomiting, unspecified: Secondary | ICD-10-CM | POA: Diagnosis present

## 2019-08-29 DIAGNOSIS — K3189 Other diseases of stomach and duodenum: Secondary | ICD-10-CM | POA: Insufficient documentation

## 2019-08-29 DIAGNOSIS — K8080 Other cholelithiasis without obstruction: Secondary | ICD-10-CM | POA: Insufficient documentation

## 2019-08-29 DIAGNOSIS — R933 Abnormal findings on diagnostic imaging of other parts of digestive tract: Secondary | ICD-10-CM | POA: Diagnosis not present

## 2019-08-29 DIAGNOSIS — Z87891 Personal history of nicotine dependence: Secondary | ICD-10-CM | POA: Insufficient documentation

## 2019-08-29 DIAGNOSIS — R1011 Right upper quadrant pain: Secondary | ICD-10-CM | POA: Diagnosis not present

## 2019-08-29 DIAGNOSIS — K5904 Chronic idiopathic constipation: Secondary | ICD-10-CM | POA: Diagnosis not present

## 2019-08-29 DIAGNOSIS — Z8711 Personal history of peptic ulcer disease: Secondary | ICD-10-CM | POA: Insufficient documentation

## 2019-08-29 DIAGNOSIS — Z79899 Other long term (current) drug therapy: Secondary | ICD-10-CM | POA: Insufficient documentation

## 2019-08-29 DIAGNOSIS — R112 Nausea with vomiting, unspecified: Secondary | ICD-10-CM | POA: Diagnosis not present

## 2019-08-29 HISTORY — PX: ESOPHAGOGASTRODUODENOSCOPY (EGD) WITH PROPOFOL: SHX5813

## 2019-08-29 HISTORY — PX: BIOPSY: SHX5522

## 2019-08-29 SURGERY — ESOPHAGOGASTRODUODENOSCOPY (EGD) WITH PROPOFOL
Anesthesia: General

## 2019-08-29 MED ORDER — GLYCOPYRROLATE 0.2 MG/ML IJ SOLN
0.2000 mg | Freq: Once | INTRAMUSCULAR | Status: AC
  Administered 2019-08-29: 0.2 mg via INTRAVENOUS
  Filled 2019-08-29: qty 1

## 2019-08-29 MED ORDER — PROPOFOL 10 MG/ML IV BOLUS
INTRAVENOUS | Status: DC | PRN
  Administered 2019-08-29: 100 mg via INTRAVENOUS
  Administered 2019-08-29: 60 mg via INTRAVENOUS

## 2019-08-29 MED ORDER — LACTATED RINGERS IV SOLN
Freq: Once | INTRAVENOUS | Status: AC
  Administered 2019-08-29: 1000 mL via INTRAVENOUS

## 2019-08-29 MED ORDER — LACTATED RINGERS IV SOLN
INTRAVENOUS | Status: DC | PRN
Start: 2019-08-29 — End: 2019-08-29

## 2019-08-29 MED ORDER — PROPOFOL 10 MG/ML IV BOLUS
INTRAVENOUS | Status: AC
  Filled 2019-08-29: qty 40

## 2019-08-29 MED ORDER — STERILE WATER FOR IRRIGATION IR SOLN
Status: DC | PRN
  Administered 2019-08-29: 2.5 mL

## 2019-08-29 MED ORDER — LIDOCAINE HCL (CARDIAC) PF 50 MG/5ML IV SOSY
PREFILLED_SYRINGE | INTRAVENOUS | Status: DC | PRN
  Administered 2019-08-29: 100 mg via INTRAVENOUS

## 2019-08-29 MED ORDER — LIDOCAINE VISCOUS HCL 2 % MT SOLN
OROMUCOSAL | Status: AC
  Filled 2019-08-29: qty 15

## 2019-08-29 MED ORDER — LIDOCAINE VISCOUS HCL 2 % MT SOLN
15.0000 mL | Freq: Once | OROMUCOSAL | Status: AC
  Administered 2019-08-29: 15 mL via OROMUCOSAL

## 2019-08-29 NOTE — Transfer of Care (Signed)
Immediate Anesthesia Transfer of Care Note  Patient: Rachel Carrillo  Procedure(s) Performed: ESOPHAGOGASTRODUODENOSCOPY (EGD) WITH PROPOFOL (N/A ) BIOPSY  Patient Location: PACU  Anesthesia Type:General  Level of Consciousness: awake, alert  and patient cooperative  Airway & Oxygen Therapy: Patient Spontanous Breathing  Post-op Assessment: Report given to RN and Post -op Vital signs reviewed and stable  Post vital signs: Reviewed and stable  Last Vitals:  Vitals Value Taken Time  BP    Temp 97.8   Pulse 73 08/29/19 1426  Resp    SpO2 100 % 08/29/19 1426  Vitals shown include unvalidated device data.  Last Pain:  Vitals:   08/29/19 1236  TempSrc: Oral  PainSc: 0-No pain      Patients Stated Pain Goal: 8 (08/29/19 1236)  Complications: No complications documented.

## 2019-08-29 NOTE — Op Note (Addendum)
[facility] Patient Name: Rachel Carrillo Procedure Date: 08/29/2019 1:33 PM MRN: [id number] Date of Birth: 1980/07/22 Attending MD: Katrinka Blazing ,  CSN: [id number] Age: 39 Admit Type: Outpatient Procedure:                Upper GI endoscopy Indications:              Abdominal pain in the right upper quadrant, vomiting Providers:                Katrinka Blazing, Nena Polio, RN, Burke Keels,                            Technician Referring MD:             Leatrice Jewels. [place] Medicines:                Monitored Anesthesia Care Complications:            No immediate complications. Estimated Blood Loss:     Estimated blood loss: none. Procedure:                Pre-Anesthesia Assessment:                           - Prior to the procedure, a History and Physical                            was performed, and patient medications, allergies                            and sensitivities were reviewed. The patient's                            tolerance of previous anesthesia was reviewed.                           - The risks and benefits of the procedure and the                            sedation options and risks were discussed with the                            patient. All questions were answered and informed                            consent was obtained.                           - ASA Grade Assessment: I - A normal, healthy                            patient.                           After obtaining informed consent, the endoscope was                            passed under direct vision. Throughout  the                            procedure, the patient's blood pressure, pulse, and                            oxygen saturations were monitored continuously. The                            [id number]-H190 ([id number]) scope was introduced through the                            mouth, and advanced to the second part of duodenum.                            The upper GI endoscopy was  accomplished without                            difficulty. The patient tolerated the procedure                            well. Scope In: 2:13:36 PM Scope Out: 2:19:08 PM Total Procedure Duration: 0 hours 5 minutes 32 seconds  Findings:      The Z-line was regular and was found 36 cm from the incisors.      The examined esophagus was normal.      Diffuse atrophic mucosa was found in the gastric antrum. Imaging was       performed using white light and narrow band imaging to visualize the       mucosa. Biopsies from body and antrum were taken with a cold forceps for       Helicobacter pylori testing.      The examined duodenum was normal. Impression:               - Z-line regular, 36 cm from the incisors.                           - Normal esophagus.                           - Gastric mucosal atrophy. Biopsied.                           - Normal examined duodenum. Moderate Sedation:      Per Anesthesia Care Recommendation:           - Discharge patient to home (ambulatory).                           - Resume previous diet.                           - Await pathology results. Procedure Code(s):        --- Professional ---                           [id number], GC, Esophagogastroduodenoscopy, flexible,  transoral; with biopsy, single or multiple Diagnosis Code(s):        --- Professional ---                           K31.89, Other diseases of stomach and duodenum                           R10.11, Right upper quadrant pain CPT copyright 2019 American Medical Association. All rights reserved. The codes documented in this report are preliminary and upon coder review may  be revised to meet current compliance requirements. Katrinka Blazing, MD Katrinka Blazing,  08/29/2019 2:28:14 PM This report has been signed electronically. Number of Addenda: 0

## 2019-08-29 NOTE — H&P (Signed)
Rachel Carrillo is an 39 y.o. female.   Chief Complaint: Abdominal pain  HPI: Briefly, this is a 53 with PMH migraines, who presents for evaluation of abdominal cramping and episodes of vomiting.  The patient has had recurrent episodes of abdominal pain and vomiting which have been self-limited. The patient was found to have gallstones in the recent CT scan performed from a DVT but also was found to have thickening of the pylorus.  She was discharged on pantoprazole and Carafate standing, as well as promethazine.  She follow with Dr. Henreitta Carrillo in clinic and is planned to undergo cholecystectomy.  Patient is to pantoprazole as she developed diarrhea and abdominal discomfort.  She has never had an EGD in the past.   Past Medical History:  Diagnosis Date  . HSV-2 infection   . Migraine     Past Surgical History:  Procedure Laterality Date  . ENDOMETRIAL ABLATION N/A 05/16/2017   Procedure: ENDOMETRIAL ABLATION WITH MINERVA;  Surgeon: [name], MD;  Location: [facility];  Service: Gynecology;  Laterality: N/A;  . HYSTEROSCOPY WITH D & C N/A 05/16/2017   Procedure: DILATATION AND CURETTAGE /HYSTEROSCOPY;  Surgeon: [name], MD;  Location: [facility];  Service: Gynecology;  Laterality: N/A;  . TUBAL LIGATION  02/18/2011   Procedure: POST PARTUM TUBAL LIGATION;  Surgeon: [name], MD;  Location: [facility];  Service: Gynecology;  Laterality: Bilateral;  Post partum tubal ligation bilateral with filshie clips.  . WISDOM TOOTH EXTRACTION      Family History  Problem Relation Age of Onset  . Hypertension Mother   . Other Mother        TIA  . Stroke Mother   . Irritable bowel syndrome Mother   . Diabetes Maternal Aunt   . Arthritis Maternal Aunt        rheumatoid  . Arthritis Maternal Uncle        rheumatoid  . Arthritis Maternal Grandmother        rheumatoid  . Crohn's disease Maternal Grandfather   . Cancer Maternal Grandfather   . Heart disease Paternal Grandfather   . Other  Paternal Grandfather        was on oxygen   Social History:  reports that she quit smoking about 8 years ago. Her smoking use included cigarettes. She has a 1.00 pack-year smoking history. She has never used smokeless tobacco. She reports that she does not drink alcohol and does not use drugs.  Allergies: No Known Allergies  Medications Prior to Admission  Medication Sig Dispense Refill  . sucralfate (CARAFATE) 1 g tablet Take 1 tablet (1 g total) by mouth 4 (four) times daily -  with meals and at bedtime. 120 tablet 1    No results found for this or any previous visit (from the past 48 hour(s)). No results found.  Review of Systems  Constitutional: Negative.   HENT: Negative.   Eyes: Negative.   Respiratory: Negative.   Cardiovascular: Negative.   Gastrointestinal: Positive for abdominal pain and nausea.  Endocrine: Negative.   Genitourinary: Negative.   Musculoskeletal: Negative.   Skin: Negative.   Allergic/Immunologic: Negative.   Neurological: Negative.   Hematological: Negative.   Psychiatric/Behavioral: Negative.     Blood pressure 114/81, temperature 98.6 F (37 C), temperature source Oral, resp. rate 18, SpO2 99 %. Physical Exam  GENERAL: The patient is AO x3, in no acute distress. HEENT: Head is normocephalic and atraumatic. EOMI are intact. Mouth is well hydrated and  without lesions. NECK: Supple. No masses LUNGS: Clear to auscultation. No presence of rhonchi/wheezing/rales. Adequate chest expansion HEART: RRR, normal s1 and s2. ABDOMEN: Soft, nontender, no guarding, no peritoneal signs, and nondistended. BS +. No masses. EXTREMITIES: Without any cyanosis, clubbing, rash, lesions or edema. NEUROLOGIC: AOx3, no focal motor deficit. SKIN: no jaundice, no rashes  Assessment/Plan Briefly, this is a 28 with PMH migraines, who presents to the hospital for evaluation of abdominal cramping and episodes of vomiting.  The patient has had self-limited episodes in the  past.  The patient reports feeling slightly better today and has not been taking Carafate or PPI at the moment.  We will investigate her abdominal pain and abnormal CT findings with an EGD today.  [name], MD 08/29/2019, 1:40 PM

## 2019-08-29 NOTE — Anesthesia Procedure Notes (Signed)
Procedure Name: MAC Date/Time: 08/29/2019 2:06 PM Performed by: Vista Deck, CRNA Pre-anesthesia Checklist: Patient identified, Emergency Drugs available, Suction available, Timeout performed and Patient being monitored Patient Re-evaluated:Patient Re-evaluated prior to induction Oxygen Delivery Method: Nasal Cannula

## 2019-08-29 NOTE — Anesthesia Postprocedure Evaluation (Signed)
Anesthesia Post Note  Patient: Rachel Carrillo  Procedure(s) Performed: ESOPHAGOGASTRODUODENOSCOPY (EGD) WITH PROPOFOL (N/A ) BIOPSY  Patient location during evaluation: PACU Anesthesia Type: General Level of consciousness: awake and alert and patient cooperative Pain management: satisfactory to patient Vital Signs Assessment: post-procedure vital signs reviewed and stable Respiratory status: spontaneous breathing Cardiovascular status: stable Postop Assessment: no apparent nausea or vomiting Anesthetic complications: no   No complications documented.   Last Vitals:  Vitals:   08/29/19 1236 08/29/19 1426  BP: 114/81   Resp: 18   Temp: 37 C (P) 36.6 C  SpO2: 99%     Last Pain:  Vitals:   08/29/19 1236  TempSrc: Oral  PainSc: 0-No pain                 Anthon Harpole

## 2019-08-29 NOTE — H&P (Signed)
[facility] History and Physical   Chief Complaint    Follow-up      Rachel Carrillo is a 39 y.o. female.  HPI:  Rachel Carrillo is a 39 yo known to me who comes in with nausea, vomiting and abdominal pain in the epigastric and RUQ. She had been getting better with PPI and carafate and was referred to GI. She still having pain in the RUQ with meals, and wants to go ahead and plan for cholecystectomy. She has constipation at baseline.  She is scheduled for an EGD tomorrow. Overall she says she is somewhat better but greasy and fatty goods cause her the most problems.    Past Medical History:  Diagnosis Date  . HSV-2 infection   . Migraine     Past Surgical History:  Procedure Laterality Date  . ENDOMETRIAL ABLATION N/A 05/16/2017   Procedure: ENDOMETRIAL ABLATION WITH MINERVA;  Surgeon: Eure, Luther H, MD;  Location: [facility];  Service: Gynecology;  Laterality: N/A;  . HYSTEROSCOPY WITH D & C N/A 05/16/2017   Procedure: DILATATION AND CURETTAGE /HYSTEROSCOPY;  Surgeon: Eure, Luther H, MD;  Location: [facility];  Service: Gynecology;  Laterality: N/A;  . TUBAL LIGATION  02/18/2011   Procedure: POST PARTUM TUBAL LIGATION;  Surgeon: Ugonna A Anyanwu, MD;  Location: [facility];  Service: Gynecology;  Laterality: Bilateral;  Post partum tubal ligation bilateral with filshie clips.  . WISDOM TOOTH EXTRACTION      Family History  Problem Relation Age of Onset  . Hypertension Mother   . Other Mother        TIA  . Stroke Mother   . Irritable bowel syndrome Mother   . Diabetes Maternal Aunt   . Arthritis Maternal Aunt        rheumatoid  . Arthritis Maternal Uncle        rheumatoid  . Arthritis Maternal Grandmother        rheumatoid  . Crohn's disease Maternal Grandfather   . Cancer Maternal Grandfather   . Heart disease Paternal Grandfather   . Other Paternal Grandfather        was on oxygen    Social History   Tobacco Use  . Smoking status: Former Smoker    Packs/day: 0.25     Years: 4.00    Pack years: 1.00    Types: Cigarettes    Quit date: 08/27/2011    Years since quitting: 8.0  . Smokeless tobacco: Never Used  Vaping Use  . Vaping Use: Never used  Substance Use Topics  . Alcohol use: No  . Drug use: No    Medications: I have reviewed the patient's current medications. Allergies as of 08/28/2019   No Known Allergies     Medication List       Accurate as of August 28, 2019 10:49 AM. If you have any questions, ask your nurse or doctor.        STOP taking these medications   acetaminophen 500 MG tablet Commonly known as: TYLENOL Stopped by: Caprice Wasko C Omaya Nieland, MD   promethazine 12.5 MG tablet Commonly known as: PHENERGAN Stopped by: Cassia Fein C Nahshon Reich, MD   rizatriptan 10 MG tablet Commonly known as: MAXALT Stopped by: Josanne Boerema C Revin Corker, MD   sucralfate 1 g tablet Commonly known as: Carafate Stopped by: Jeaneen Cala C Karyl Sharrar, MD        ROS:  A comprehensive review of systems was negative except for: Gastrointestinal: positive for abdominal pain, constipation, nausea and vomiting    Blood pressure 101/70, pulse 63, temperature (!) 97 F (36.1 C), temperature source Oral, resp. rate 14, height 5\' 4"  (1.626 m), weight 186 lb (84.4 kg), SpO2 97 %. Physical Exam Vitals reviewed.  Constitutional:      Appearance: Normal appearance.  HENT:     Head: Normocephalic.     Nose: Nose normal.  Eyes:     Pupils: Pupils are equal, round, and reactive to light.  Cardiovascular:     Rate and Rhythm: Normal rate.  Pulmonary:     Effort: Pulmonary effort is normal.     Breath sounds: Normal breath sounds.  Abdominal:     General: There is no distension.     Palpations: Abdomen is soft.     Tenderness: There is abdominal tenderness in the right upper quadrant and epigastric area.  Musculoskeletal:        General: No swelling. Normal range of motion.     Cervical back: Normal range of motion.  Skin:    General: Skin is warm and dry.    Neurological:     General: No focal deficit present.     Mental Status: She is alert and oriented to person, place, and time.  Psychiatric:        Mood and Affect: Mood normal.        Behavior: Behavior normal.        Thought Content: Thought content normal.        Judgment: Judgment normal.     Results: Results for orders placed or performed during the hospital encounter of 08/27/19 (from the past 48 hour(s))  SARS CORONAVIRUS 2 (TAT 6-24 HRS) Nasopharyngeal Nasopharyngeal Swab     Status: None   Collection Time: 08/27/19  9:19 AM   Specimen: Nasopharyngeal Swab  Result Value Ref Range   SARS Coronavirus 2 NEGATIVE NEGATIVE    Comment: (NOTE) SARS-CoV-2 target nucleic acids are NOT DETECTED.  The SARS-CoV-2 RNA is generally detectable in upper and lower respiratory specimens during the acute phase of infection. Negative results do not preclude SARS-CoV-2 infection, do not rule out co-infections with other pathogens, and should not be used as the sole basis for treatment or other patient management decisions. Negative results must be combined with clinical observations, patient history, and epidemiological information. The expected result is Negative.  Fact Sheet for Patients: 10/27/19  Fact Sheet for Healthcare Providers: HairSlick.no  This test is not yet approved or cleared by the quierodirigir.com FDA and  has been authorized for detection and/or diagnosis of SARS-CoV-2 by FDA under an Emergency Use Authorization (EUA). This EUA will remain  in effect (meaning this test can be used) for the duration of the COVID-19 declaration under Se ction 564(b)(1) of the Act, 21 U.S.C. section 360bbb-3(b)(1), unless the authorization is terminated or revoked sooner.  Performed at [facility], [address] [place], [place]   Pregnancy, urine     Status: None   Collection Time: 08/27/19  9:30 AM   Result Value Ref Range   Preg Test, Ur NEGATIVE NEGATIVE    Comment:        THE SENSITIVITY OF THIS METHODOLOGY IS >20 mIU/mL. Performed at [facility], [address], [place], [place]     CLINICAL DATA:  Nausea vomiting  EXAM: CT ABDOMEN AND PELVIS WITH CONTRAST  TECHNIQUE: Multidetector CT imaging of the abdomen and pelvis was performed using the standard protocol following bolus administration of intravenous contrast.  CONTRAST:  40973 OMNIPAQUE  IOHEXOL 300 MG/ML  SOLN  COMPARISON:  Pelvic ultrasound 05/01/2017  FINDINGS: Lower chest: Lung bases demonstrate no acute consolidation or effusion. Cardiac size within normal limits.  Hepatobiliary: Multiple gallstones. No focal hepatic abnormality or biliary dilatation.  Pancreas: Unremarkable. No pancreatic ductal dilatation or surrounding inflammatory changes.  Spleen: Borderline enlarged at 13 cm.  Adrenals/Urinary Tract: Adrenal glands are normal. Kidneys show no hydronephrosis. The bladder is normal  Stomach/Bowel: The stomach is nonenlarged. Slightly indistinct appearance of the pylorus. No dilated small bowel. No bowel wall thickening. Negative appendix.  Vascular/Lymphatic: No significant vascular findings are present. No enlarged abdominal or pelvic lymph nodes.  Reproductive: Uterus unremarkable. Left ligation clip is in place. A second ligation clip is seen along the left uterine body. No adnexal mass.  Other: No abdominal wall hernia or abnormality. No abdominopelvic ascites.  Musculoskeletal: No acute or significant osseous findings.  IMPRESSION: 1. Indistinct slightly thickened appearance of pylorus, question peptic ulcer disease. 2. Gallstones. 3. Status post tubal ligation with displacement of right ligation clip to the left pelvis, adjacent to the body of the uterus.   Electronically Signed   By: Kim  Fujinaga M.D.   On: 07/25/2019 18:33   Assessment &  Plan:  Rachel Carrillo is a 39 y.o. female with symptomatic cholelithiasis and potentially some gastritis/ ulcer disease. She is seeing GI.   -PLAN: I counseled the patient about the indication, risks and benefits of laparoscopic cholecystectomy.  She understands there is a very small chance for bleeding, infection, injury to normal structures (including common bile duct), conversion to open surgery, persistent symptoms, evolution of postcholecystectomy diarrhea, need for secondary interventions, anesthesia reaction, cardiopulmonary issues and other risks not specifically detailed here. I described the expected recovery, the plan for follow-up and the restrictions during the recovery phase.  All questions were answered.  Discussed preop COVID testing.  All questions were answered to the satisfaction of the patient and family.    Karena Kinker C Mahamed Zalewski 08/28/2019, 10:49 AM       

## 2019-08-29 NOTE — Anesthesia Preprocedure Evaluation (Signed)
Anesthesia Evaluation  Patient identified by MRN, date of birth, ID band Patient awake    Reviewed: Allergy & Precautions, NPO status , Patient's Chart, lab work & pertinent test results  History of Anesthesia Complications Negative for: history of anesthetic complications  Airway Mallampati: II  TM Distance: >3 FB Neck ROM: Full    Dental  (+) Teeth Intact, Dental Advisory Given   Pulmonary former smoker,    Pulmonary exam normal breath sounds clear to auscultation       Cardiovascular Exercise Tolerance: Good Normal cardiovascular exam Rhythm:Regular Rate:Normal     Neuro/Psych  Headaches, negative psych ROS   GI/Hepatic Neg liver ROS, PUD, neg GERD  ,  Endo/Other  negative endocrine ROS  Renal/GU negative Renal ROS  negative genitourinary   Musculoskeletal negative musculoskeletal ROS (+)   Abdominal   Peds  Hematology negative hematology ROS (+)   Anesthesia Other Findings   Reproductive/Obstetrics negative OB ROS                            Anesthesia Physical Anesthesia Plan  ASA: II  Anesthesia Plan: General   Post-op Pain Management:    Induction: Intravenous  PONV Risk Score and Plan:   Airway Management Planned: Nasal Cannula, Natural Airway and Simple Face Mask  Additional Equipment:   Intra-op Plan:   Post-operative Plan:   Informed Consent: I have reviewed the patients History and Physical, chart, labs and discussed the procedure including the risks, benefits and alternatives for the proposed anesthesia with the patient or authorized representative who has indicated his/her understanding and acceptance.     Dental advisory given  Plan Discussed with: CRNA  Anesthesia Plan Comments:         Anesthesia Quick Evaluation

## 2019-08-29 NOTE — Discharge Instructions (Signed)
Upper Endoscopy, Adult, Care After This sheet gives you information about how to care for yourself after your procedure. Your health care provider may also give you more specific instructions. If you have problems or questions, contact your health care provider. What can I expect after the procedure? After the procedure, it is common to have:  A sore throat.  Mild stomach pain or discomfort.  Bloating.  Nausea. Follow these instructions at home:   Follow instructions from your health care provider about what to eat or drink after your procedure.  Return to your normal activities as told by your health care provider. Ask your health care provider what activities are safe for you.  Take over-the-counter and prescription medicines only as told by your health care provider.  Do not drive for 24 hours if you were given a sedative during your procedure.  Keep all follow-up visits as told by your health care provider. This is important. Contact a health care provider if you have:  A sore throat that lasts longer than one day.  Trouble swallowing. Get help right away if:  You vomit blood or your vomit looks like coffee grounds.  You have: ? A fever. ? Bloody, black, or tarry stools. ? A severe sore throat or you cannot swallow. ? Difficulty breathing. ? Severe pain in your chest or abdomen. Summary  After the procedure, it is common to have a sore throat, mild stomach discomfort, bloating, and nausea.  Do not drive for 24 hours if you were given a sedative during the procedure.  Follow instructions from your health care provider about what to eat or drink after your procedure.  Return to your normal activities as told by your health care provider. This information is not intended to replace advice given to you by your health care provider. Make sure you discuss any questions you have with your health care provider. Document Revised: 07/03/2017 Document Reviewed:  06/11/2017 Elsevier Patient Education  2020 [employer]. You are being discharged to home.  Resume your previous diet.  We are waiting for your pathology results.

## 2019-09-01 ENCOUNTER — Encounter (HOSPITAL_COMMUNITY)
Admission: RE | Admit: 2019-09-01 | Discharge: 2019-09-01 | Disposition: A | Discharge status: Home / Self Care | Payer: Commercial Managed Care - PPO | Source: Ambulatory Visit | Attending: General Surgery | Admitting: General Surgery

## 2019-09-01 ENCOUNTER — Other Ambulatory Visit (HOSPITAL_COMMUNITY)
Admission: RE | Admit: 2019-09-01 | Discharge: 2019-09-01 | Disposition: A | Discharge status: Home / Self Care | Payer: Commercial Managed Care - PPO | Source: Ambulatory Visit | Attending: General Surgery | Admitting: General Surgery

## 2019-09-01 ENCOUNTER — Encounter (HOSPITAL_COMMUNITY): Payer: Self-pay

## 2019-09-01 ENCOUNTER — Other Ambulatory Visit: Payer: Self-pay

## 2019-09-01 DIAGNOSIS — Z20822 Contact with and (suspected) exposure to covid-19: Secondary | ICD-10-CM | POA: Insufficient documentation

## 2019-09-01 DIAGNOSIS — Z01812 Encounter for preprocedural laboratory examination: Secondary | ICD-10-CM | POA: Insufficient documentation

## 2019-09-02 ENCOUNTER — Other Ambulatory Visit: Payer: Self-pay

## 2019-09-02 LAB — SARS CORONAVIRUS 2 (TAT 6-24 HRS): SARS Coronavirus 2: NEGATIVE

## 2019-09-02 LAB — SURGICAL PATHOLOGY

## 2019-09-03 ENCOUNTER — Other Ambulatory Visit: Payer: Self-pay

## 2019-09-03 ENCOUNTER — Ambulatory Visit (HOSPITAL_COMMUNITY): Payer: Commercial Managed Care - PPO | Admitting: Anesthesiology

## 2019-09-03 ENCOUNTER — Ambulatory Visit (HOSPITAL_COMMUNITY)
Admission: RE | Admit: 2019-09-03 | Discharge: 2019-09-03 | Disposition: A | Discharge status: Home / Self Care | Payer: Commercial Managed Care - PPO | Attending: General Surgery | Admitting: General Surgery

## 2019-09-03 ENCOUNTER — Encounter (HOSPITAL_COMMUNITY)
Admission: RE | Disposition: A | Discharge status: Home / Self Care | Payer: Self-pay | Source: Home / Self Care | Attending: General Surgery

## 2019-09-03 ENCOUNTER — Encounter (HOSPITAL_COMMUNITY): Payer: Self-pay | Admitting: General Surgery

## 2019-09-03 DIAGNOSIS — Z87891 Personal history of nicotine dependence: Secondary | ICD-10-CM | POA: Diagnosis not present

## 2019-09-03 DIAGNOSIS — M549 Dorsalgia, unspecified: Secondary | ICD-10-CM | POA: Insufficient documentation

## 2019-09-03 DIAGNOSIS — R112 Nausea with vomiting, unspecified: Secondary | ICD-10-CM | POA: Insufficient documentation

## 2019-09-03 DIAGNOSIS — K8020 Calculus of gallbladder without cholecystitis without obstruction: Secondary | ICD-10-CM

## 2019-09-03 DIAGNOSIS — K8010 Calculus of gallbladder with chronic cholecystitis without obstruction: Secondary | ICD-10-CM | POA: Diagnosis not present

## 2019-09-03 DIAGNOSIS — Z5321 Procedure and treatment not carried out due to patient leaving prior to being seen by health care provider: Secondary | ICD-10-CM | POA: Insufficient documentation

## 2019-09-03 HISTORY — PX: CHOLECYSTECTOMY: SHX55

## 2019-09-03 SURGERY — LAPAROSCOPIC CHOLECYSTECTOMY
Anesthesia: General | Site: Abdomen

## 2019-09-03 MED ORDER — CHLORHEXIDINE GLUCONATE CLOTH 2 % EX PADS: 6.0000 | MEDICATED_PAD | Freq: Once | CUTANEOUS | Status: DC

## 2019-09-03 MED ORDER — SCOPOLAMINE 1 MG/3DAYS TD PT72
1.0000 | MEDICATED_PATCH | TRANSDERMAL | Status: DC
  Administered 2019-09-03: 1.5 mg via TRANSDERMAL

## 2019-09-03 MED ORDER — DEXAMETHASONE SODIUM PHOSPHATE 10 MG/ML IJ SOLN
INTRAMUSCULAR | Status: AC
  Filled 2019-09-03: qty 1

## 2019-09-03 MED ORDER — LACTATED RINGERS IV SOLN: INTRAVENOUS | Status: DC | PRN

## 2019-09-03 MED ORDER — PROPOFOL 10 MG/ML IV BOLUS
INTRAVENOUS | Status: DC | PRN
  Administered 2019-09-03: 180 mg via INTRAVENOUS

## 2019-09-03 MED ORDER — MIDAZOLAM HCL 2 MG/2ML IJ SOLN
INTRAMUSCULAR | Status: AC
  Filled 2019-09-03: qty 2

## 2019-09-03 MED ORDER — SODIUM CHLORIDE 0.9 % IV SOLN
2.0000 g | INTRAVENOUS | Status: AC
  Administered 2019-09-03: 2 g via INTRAVENOUS

## 2019-09-03 MED ORDER — ORAL CARE MOUTH RINSE: 15.0000 mL | Freq: Once | OROMUCOSAL | Status: AC

## 2019-09-03 MED ORDER — ONDANSETRON HCL 4 MG PO TABS: 4.0000 mg | ORAL_TABLET | Freq: Three times a day (TID) | ORAL | 1 refills | Status: DC | PRN

## 2019-09-03 MED ORDER — FENTANYL CITRATE (PF) 100 MCG/2ML IJ SOLN
INTRAMUSCULAR | Status: AC
  Filled 2019-09-03: qty 2

## 2019-09-03 MED ORDER — ONDANSETRON HCL 4 MG/2ML IJ SOLN
INTRAMUSCULAR | Status: DC | PRN
  Administered 2019-09-03: 4 mg via INTRAVENOUS

## 2019-09-03 MED ORDER — ONDANSETRON HCL 4 MG/2ML IJ SOLN
INTRAMUSCULAR | Status: AC
  Filled 2019-09-03: qty 2

## 2019-09-03 MED ORDER — CHLORHEXIDINE GLUCONATE 0.12 % MT SOLN
15.0000 mL | Freq: Once | OROMUCOSAL | Status: AC
  Administered 2019-09-03: 15 mL via OROMUCOSAL

## 2019-09-03 MED ORDER — LIDOCAINE 2% (20 MG/ML) 5 ML SYRINGE
INTRAMUSCULAR | Status: AC
  Filled 2019-09-03: qty 5

## 2019-09-03 MED ORDER — SUGAMMADEX SODIUM 500 MG/5ML IV SOLN
INTRAVENOUS | Status: DC | PRN
  Administered 2019-09-03: 200 mg via INTRAVENOUS

## 2019-09-03 MED ORDER — SODIUM CHLORIDE 0.9 % IR SOLN
Status: DC | PRN
  Administered 2019-09-03: 1000 mL

## 2019-09-03 MED ORDER — SCOPOLAMINE 1 MG/3DAYS TD PT72
MEDICATED_PATCH | TRANSDERMAL | Status: AC
  Filled 2019-09-03: qty 1

## 2019-09-03 MED ORDER — ROCURONIUM BROMIDE 10 MG/ML (PF) SYRINGE
PREFILLED_SYRINGE | INTRAVENOUS | Status: AC
  Filled 2019-09-03: qty 10

## 2019-09-03 MED ORDER — BUPIVACAINE HCL (PF) 0.5 % IJ SOLN
INTRAMUSCULAR | Status: AC
  Filled 2019-09-03: qty 30

## 2019-09-03 MED ORDER — FENTANYL CITRATE (PF) 100 MCG/2ML IJ SOLN
25.0000 ug | INTRAMUSCULAR | Status: DC | PRN
  Administered 2019-09-03 (×2): 50 ug via INTRAVENOUS

## 2019-09-03 MED ORDER — MIDAZOLAM HCL 5 MG/5ML IJ SOLN
INTRAMUSCULAR | Status: DC | PRN
  Administered 2019-09-03: 2 mg via INTRAVENOUS

## 2019-09-03 MED ORDER — ONDANSETRON HCL 4 MG/2ML IJ SOLN
4.0000 mg | Freq: Once | INTRAMUSCULAR | Status: AC | PRN
  Administered 2019-09-03: 4 mg via INTRAVENOUS
  Filled 2019-09-03: qty 2

## 2019-09-03 MED ORDER — FENTANYL CITRATE (PF) 250 MCG/5ML IJ SOLN
INTRAMUSCULAR | Status: AC
  Filled 2019-09-03: qty 5

## 2019-09-03 MED ORDER — FENTANYL CITRATE (PF) 100 MCG/2ML IJ SOLN
INTRAMUSCULAR | Status: DC | PRN
  Administered 2019-09-03: 50 ug via INTRAVENOUS
  Administered 2019-09-03 (×2): 100 ug via INTRAVENOUS

## 2019-09-03 MED ORDER — OXYCODONE HCL 5 MG PO TABS: 5.0000 mg | ORAL_TABLET | ORAL | 0 refills | Status: AC | PRN

## 2019-09-03 MED ORDER — DEXAMETHASONE SODIUM PHOSPHATE 10 MG/ML IJ SOLN
INTRAMUSCULAR | Status: DC | PRN
  Administered 2019-09-03: 8 mg via INTRAVENOUS

## 2019-09-03 MED ORDER — BUPIVACAINE HCL (PF) 0.5 % IJ SOLN
INTRAMUSCULAR | Status: DC | PRN
  Administered 2019-09-03: 10 mL

## 2019-09-03 MED ORDER — HEMOSTATIC AGENTS (NO CHARGE) OPTIME
TOPICAL | Status: DC | PRN
  Administered 2019-09-03: 1 via TOPICAL

## 2019-09-03 MED ORDER — ROCURONIUM BROMIDE 100 MG/10ML IV SOLN
INTRAVENOUS | Status: DC | PRN
  Administered 2019-09-03: 50 mg via INTRAVENOUS

## 2019-09-03 MED ORDER — SODIUM CHLORIDE 0.9 % IV SOLN
INTRAVENOUS | Status: AC
  Filled 2019-09-03: qty 2

## 2019-09-03 MED ORDER — LACTATED RINGERS IV SOLN
INTRAVENOUS | Status: DC
  Administered 2019-09-03: 1000 mL via INTRAVENOUS

## 2019-09-03 MED ORDER — PROPOFOL 10 MG/ML IV BOLUS
INTRAVENOUS | Status: AC
  Filled 2019-09-03: qty 20

## 2019-09-03 MED ORDER — LIDOCAINE HCL (CARDIAC) PF 100 MG/5ML IV SOSY
PREFILLED_SYRINGE | INTRAVENOUS | Status: DC | PRN
  Administered 2019-09-03: 100 mg via INTRAVENOUS

## 2019-09-03 SURGICAL SUPPLY — 49 items
ADH SKN CLS APL DERMABOND .7 (GAUZE/BANDAGES/DRESSINGS) ×1
APL PRP STRL LF DISP 70% ISPRP (MISCELLANEOUS) ×1
APPLIER CLIP ROT 10 11.4 M/L (STAPLE) ×3
APR CLP MED LRG 11.4X10 (STAPLE) ×1
BAG RETRIEVAL 10 (BASKET) ×1
BAG RETRIEVAL 10MM (BASKET) ×1
BLADE SURG 15 STRL LF DISP TIS (BLADE) ×1 IMPLANT
BLADE SURG 15 STRL SS (BLADE) ×3
CHLORAPREP W/TINT 26 (MISCELLANEOUS) ×3 IMPLANT
CLIP APPLIE ROT 10 11.4 M/L (STAPLE) ×1 IMPLANT
CLOTH BEACON ORANGE TIMEOUT ST (SAFETY) ×3 IMPLANT
COVER LIGHT HANDLE STERIS (MISCELLANEOUS) ×6 IMPLANT
COVER WAND RF STERILE (DRAPES) ×3 IMPLANT
DECANTER SPIKE VIAL GLASS SM (MISCELLANEOUS) ×3 IMPLANT
DERMABOND ADVANCED (GAUZE/BANDAGES/DRESSINGS) ×2
DERMABOND ADVANCED .7 DNX12 (GAUZE/BANDAGES/DRESSINGS) ×1 IMPLANT
ELECT REM PT RETURN 9FT ADLT (ELECTROSURGICAL) ×3
ELECTRODE REM PT RTRN 9FT ADLT (ELECTROSURGICAL) ×1 IMPLANT
GLOVE BIO SURGEON STRL SZ 6.5 (GLOVE) ×2 IMPLANT
GLOVE BIO SURGEONS STRL SZ 6.5 (GLOVE) ×1
GLOVE BIOGEL PI IND STRL 6.5 (GLOVE) ×1 IMPLANT
GLOVE BIOGEL PI IND STRL 7.0 (GLOVE) ×3 IMPLANT
GLOVE BIOGEL PI INDICATOR 6.5 (GLOVE) ×2
GLOVE BIOGEL PI INDICATOR 7.0 (GLOVE) ×6
GLOVE ECLIPSE 6.5 STRL STRAW (GLOVE) ×2 IMPLANT
GLOVE ECLIPSE 7.0 STRL STRAW (GLOVE) ×2 IMPLANT
GOWN STRL REUS W/TWL LRG LVL3 (GOWN DISPOSABLE) ×9 IMPLANT
HEMOSTAT SNOW SURGICEL 2X4 (HEMOSTASIS) ×3 IMPLANT
INST SET LAPROSCOPIC AP (KITS) ×3 IMPLANT
KIT TURNOVER KIT A (KITS) ×3 IMPLANT
MANIFOLD NEPTUNE II (INSTRUMENTS) ×3 IMPLANT
NDL INSUFFLATION 14GA 120MM (NEEDLE) ×1 IMPLANT
NEEDLE INSUFFLATION 14GA 120MM (NEEDLE) ×3 IMPLANT
NS IRRIG 1000ML POUR BTL (IV SOLUTION) ×3 IMPLANT
PACK LAP CHOLE LZT030E (CUSTOM PROCEDURE TRAY) ×2 IMPLANT
PAD ARMBOARD 7.5X6 YLW CONV (MISCELLANEOUS) ×3 IMPLANT
SET BASIN LINEN APH (SET/KITS/TRAYS/PACK) ×3 IMPLANT
SET TUBE SMOKE EVAC HIGH FLOW (TUBING) ×3 IMPLANT
SLEEVE ENDOPATH XCEL 5M (ENDOMECHANICALS) ×3 IMPLANT
SUT MNCRL AB 4-0 PS2 18 (SUTURE) ×6 IMPLANT
SUT VICRYL 0 UR6 27IN ABS (SUTURE) ×3 IMPLANT
SYS BAG RETRIEVAL 10MM (BASKET) ×1
SYSTEM BAG RETRIEVAL 10MM (BASKET) ×1 IMPLANT
TROCAR ENDO BLADELESS 11MM (ENDOMECHANICALS) ×3 IMPLANT
TROCAR XCEL NON-BLD 5MMX100MML (ENDOMECHANICALS) ×3 IMPLANT
TROCAR XCEL UNIV SLVE 11M 100M (ENDOMECHANICALS) ×3 IMPLANT
TUBE CONNECTING 12'X1/4 (SUCTIONS) ×1
TUBE CONNECTING 12X1/4 (SUCTIONS) ×2 IMPLANT
WARMER LAPAROSCOPE (MISCELLANEOUS) ×3 IMPLANT

## 2019-09-03 NOTE — Anesthesia Preprocedure Evaluation (Signed)
Anesthesia Evaluation  Patient identified by MRN, date of birth, ID band Patient awake    Reviewed: Allergy & Precautions, H&P , NPO status , Patient's Chart, lab work & pertinent test results, reviewed documented beta blocker date and time   Airway Mallampati: II  TM Distance: >3 FB Neck ROM: full    Dental no notable dental hx.    Pulmonary neg pulmonary ROS, former smoker,    Pulmonary exam normal breath sounds clear to auscultation       Cardiovascular Exercise Tolerance: Good negative cardio ROS   Rhythm:regular Rate:Normal     Neuro/Psych  Headaches, negative psych ROS   GI/Hepatic Neg liver ROS, PUD,   Endo/Other  negative endocrine ROS  Renal/GU negative Renal ROS  negative genitourinary   Musculoskeletal   Abdominal   Peds  Hematology negative hematology ROS (+)   Anesthesia Other Findings   Reproductive/Obstetrics negative OB ROS                             Anesthesia Physical Anesthesia Plan  ASA: II  Anesthesia Plan: General   Post-op Pain Management:    Induction:   PONV Risk Score and Plan: 3 and Ondansetron  Airway Management Planned:   Additional Equipment:   Intra-op Plan:   Post-operative Plan:   Informed Consent: I have reviewed the patients History and Physical, chart, labs and discussed the procedure including the risks, benefits and alternatives for the proposed anesthesia with the patient or authorized representative who has indicated his/her understanding and acceptance.     Dental Advisory Given  Plan Discussed with: CRNA  Anesthesia Plan Comments:         Anesthesia Quick Evaluation

## 2019-09-03 NOTE — Progress Notes (Signed)
Pt became nauseated in post-op. Received 4 mg of Zofran IV and Scopolamine patch was ordered and applied behind right ear. RN also did peppermint oil inhalation therapy Pt did 100 ml bile-colored emesis, pt said she felt better after vomiting.

## 2019-09-03 NOTE — Anesthesia Postprocedure Evaluation (Signed)
Anesthesia Post Note  Patient: Rachel Carrillo  Procedure(s) Performed: LAPAROSCOPIC CHOLECYSTECTOMY (N/A Abdomen)  Patient location during evaluation: PACU Anesthesia Type: General Level of consciousness: awake, oriented, awake and alert and patient cooperative Pain management: satisfactory to patient Vital Signs Assessment: post-procedure vital signs reviewed and stable Respiratory status: spontaneous breathing, respiratory function stable and nonlabored ventilation Cardiovascular status: stable Postop Assessment: no apparent nausea or vomiting Anesthetic complications: no   No complications documented.   Last Vitals:  Vitals:   09/03/19 0838 09/03/19 1027  BP: 108/75 119/77  Pulse:  89  Resp: 20 15  Temp: 36.7 C (!) 36.4 C  SpO2: 97% 99%    Last Pain:  Vitals:   09/03/19 0838  TempSrc: Oral  PainSc: 0-No pain                 Dariush Mcnellis

## 2019-09-03 NOTE — Anesthesia Procedure Notes (Signed)
Procedure Name: Intubation Date/Time: 09/03/2019 9:27 AM Performed by: Jonna Munro, CRNA Pre-anesthesia Checklist: Patient identified, Emergency Drugs available, Suction available, Patient being monitored and Timeout performed Patient Re-evaluated:Patient Re-evaluated prior to induction Oxygen Delivery Method: Circle system utilized Preoxygenation: Pre-oxygenation with 100% oxygen Induction Type: IV induction Ventilation: Mask ventilation without difficulty Laryngoscope Size: Mac and 3 Grade View: Grade I Tube type: Oral Tube size: 7.0 mm Number of attempts: 1 Airway Equipment and Method: Stylet Placement Confirmation: ETT inserted through vocal cords under direct vision,  positive ETCO2,  CO2 detector and breath sounds checked- equal and bilateral Secured at: 22 cm Tube secured with: Tape Dental Injury: Teeth and Oropharynx as per pre-operative assessment

## 2019-09-03 NOTE — Discharge Instructions (Signed)
Discharge Laparoscopic Surgery Instructions:  Common Complaints: Right shoulder pain is common after laparoscopic surgery. This is secondary to the gas used in the surgery being trapped under the diaphragm.  Walk to help your body absorb the gas. This will improve in a few days. Pain at the port sites are common, especially the larger port sites. This will improve with time.  Some nausea is common and poor appetite. The main goal is to stay hydrated the first few days after surgery.   Diet/ Activity: Diet as tolerated. You may not have an appetite, but it is important to stay hydrated. Drink 64 ounces of water a day. Your appetite will return with time.  Shower per your regular routine daily.  Do not take hot showers. Take warm showers that are less than 10 minutes. Rest and listen to your body, but do not remain in bed all day.  Walk everyday for at least 15-20 minutes. Deep cough and move around every 1-2 hours in the first few days after surgery.  Do not lift > 10 lbs, perform excessive bending, pushing, pulling, squatting for 1-2 weeks after surgery.  Do not pick at the dermabond glue on your incision sites.  This glue film will remain in place for 1-2 weeks and will start to peel off.  Do not place lotions or balms on your incision unless instructed to specifically by Dr. Bridges.   Pain Expectations and Narcotics: -After surgery you will have pain associated with your incisions and this is normal. The pain is muscular and nerve pain, and will get better with time. -You are encouraged and expected to take non narcotic medications like tylenol and ibuprofen (when able) to treat pain as multiple modalities can aid with pain treatment. -Narcotics are only used when pain is severe or there is breakthrough pain. -You are not expected to have a pain score of 0 after surgery, as we cannot prevent pain. A pain score of 3-4 that allows you to be functional, move, walk, and tolerate some activity is  the goal. The pain will continue to improve over the days after surgery and is dependent on your surgery. -Due to [place] law, we are only able to give a certain amount of pain medication to treat post operative pain, and we only give additional narcotics on a patient by patient basis.  -For most laparoscopic surgery, studies have shown that the majority of patients only need 10-15 narcotic pills, and for open surgeries most patients only need 15-20.   -Having appropriate expectations of pain and knowledge of pain management with non narcotics is important as we do not want anyone to become addicted to narcotic pain medication.  -Using ice packs in the first 48 hours and heating pads after 48 hours, wearing an abdominal binder (when recommended), and using over the counter medications are all ways to help with pain management.   -Simple acts like meditation and mindfulness practices after surgery can also help with pain control and research has proven the benefit of these practices.  Medication: Take tylenol and ibuprofen as needed for pain control, alternating every 4-6 hours.  Example:  Tylenol 1000mg @ 6am, 12noon, 6pm, 12midnight (Do not exceed 4000mg of tylenol a day). Ibuprofen 800mg @ 9am, 3pm, 9pm, 3am (Do not exceed 3600mg of ibuprofen a day).  Take Roxicodone for breakthrough pain every 4 hours.  Take Colace for constipation related to narcotic pain medication. If you do not have a bowel movement in 2 days, take Miralax   over the counter.  Drink plenty of water to also prevent constipation.   Contact Information: If you have questions or concerns, please call our office, [phone], Monday- Thursday 8AM-5PM and Friday 8AM-12Noon.  If it is after hours or on the weekend, please call [facility] Main Number, [phone], and ask to speak to the surgeon on call for Dr. Bridges at [place].    Laparoscopic Cholecystectomy, Care After This sheet gives you information about how to care for  yourself after your procedure. Your doctor may also give you more specific instructions. If you have problems or questions, contact your doctor. Follow these instructions at home: Care for cuts from surgery (incisions)   Follow instructions from your doctor about how to take care of your cuts from surgery. Make sure you: ? Wash your hands with soap and water before you change your bandage (dressing). If you cannot use soap and water, use hand sanitizer. ? Change your bandage as told by your doctor. ? Leave stitches (sutures), skin glue, or skin tape (adhesive) strips in place. They may need to stay in place for 2 weeks or longer. If tape strips get loose and curl up, you may trim the loose edges. Do not remove tape strips completely unless your doctor says it is okay.  Do not take baths, swim, or use a hot tub until your doctor says it is okay.   You may shower.  Check your surgical cut area every day for signs of infection. Check for: ? More redness, swelling, or pain. ? More fluid or blood. ? Warmth. ? Pus or a bad smell. Activity  Do not drive or use heavy machinery while taking prescription pain medicine.  Do not lift anything that is heavier than 10 lb (4.5 kg) until your doctor says it is okay.  Do not play contact sports until your doctor says it is okay.  Do not drive for 24 hours if you were given a medicine to help you relax (sedative).  Rest as needed. Do not return to work or school until your doctor says it is okay. General instructions  Take over-the-counter and prescription medicines only as told by your doctor.  To prevent or treat constipation while you are taking prescription pain medicine, your doctor may recommend that you: ? Drink enough fluid to keep your pee (urine) clear or pale yellow. ? Take over-the-counter or prescription medicines. ? Eat foods that are high in fiber, such as fresh fruits and vegetables, whole grains, and beans. ? Limit foods that are  high in fat and processed sugars, such as fried and sweet foods. Contact a doctor if:  You develop a rash.  You have more redness, swelling, or pain around your surgical cuts.  You have more fluid or blood coming from your surgical cuts.  Your surgical cuts feel warm to the touch.  You have pus or a bad smell coming from your surgical cuts.  You have a fever.  One or more of your surgical cuts breaks open. Get help right away if:  You have trouble breathing.  You have chest pain.  You have pain that is getting worse in your shoulders.  You faint or feel dizzy when you stand.  You have very bad pain in your belly (abdomen).  You are sick to your stomach (nauseous) for more than one day.  You have throwing up (vomiting) that lasts for more than one day.  You have leg pain. This information is not intended to replace   advice given to you by your health care provider. Make sure you discuss any questions you have with your health care provider. Document Revised: 12/22/2016 Document Reviewed: 06/28/2015 Elsevier Patient Education  2020 [employer] Inc[employer]  

## 2019-09-03 NOTE — Transfer of Care (Signed)
Immediate Anesthesia Transfer of Care Note  Patient: Rachel Carrillo  Procedure(s) Performed: LAPAROSCOPIC CHOLECYSTECTOMY (N/A Abdomen)  Patient Location: PACU  Anesthesia Type:General  Level of Consciousness: awake, alert , oriented and patient cooperative  Airway & Oxygen Therapy: Patient Spontanous Breathing  Post-op Assessment: Report given to RN, Post -op Vital signs reviewed and stable and Patient moving all extremities X 4  Post vital signs: Reviewed and stable  Last Vitals:  Vitals Value Taken Time  BP    Temp    Pulse    Resp    SpO2      Last Pain:  Vitals:   09/03/19 0838  TempSrc: Oral  PainSc: 0-No pain      Patients Stated Pain Goal: 9 (09/03/19 2257)  Complications: No complications documented.

## 2019-09-03 NOTE — Op Note (Signed)
Operative Note   Preoperative Diagnosis: Symptomatic cholelithiasis   Postoperative Diagnosis: Same   Procedure(s) Performed: Laparoscopic cholecystectomy   Surgeon: Lillia Abed C. Henreitta Leber, MD   Assistants: No qualified resident was available   Anesthesia: General endotracheal   Anesthesiologist: Windell Norfolk, MD    Specimens: Gallbladder    Estimated Blood Loss: Minimal    Blood Replacement: None    Complications: None    Operative Findings:  Distended gallbladder with stones   Procedure: The patient was taken to the operating room and placed supine. General endotracheal anesthesia was induced. Intravenous antibiotics were administered per protocol. An orogastric tube positioned to decompress the stomach. The abdomen was prepared and draped in the usual sterile fashion.    A supraumbilical incision was made and a Veress technique was utilized to achieve pneumoperitoneum to 15 mmHg with carbon dioxide. A 11 mm optiview port was placed through the supraumbilical region, and a 10 mm 0-degree operative laparoscope was introduced. The area underlying the trocar and Veress needle were inspected and without evidence of injury.  Remaining trocars were placed under direct vision. Two 5 mm ports were placed in the right abdomen, between the anterior axillary and midclavicular line.  A final 11 mm port was placed through the mid-epigastrium, near the falciform ligament.    The gallbladder fundus was elevated cephalad and the infundibulum was retracted to the patient's right. The gallbladder/cystic duct junction was skeletonized. The cystic artery noted in the triangle of Calot and was also skeletonized.  We then continued liberal medial and lateral dissection until the critical view of safety was achieved.    The cystic duct and cystic artery were triply clipped and divided. The gallbladder was then dissected from the liver bed with electrocautery. The specimen was placed in an Endopouch and was  retrieved through the epigastric site.   Final inspection revealed acceptable hemostasis. Surgical SNOW was placed in the gallbladder bed.  Trocars were removed and pneumoperitoneum was released.  0 Vicryl fascial sutures were used to close the epigastric and umbilical port sites. Skin incisions were closed with 4-0 Monocryl subcuticular sutures and Dermabond. The patient was awakened from anesthesia and extubated without complication.    Algis Greenhouse, MD [facility] [address] [place] [place], [place] [address] [phone] (office)

## 2019-09-03 NOTE — Progress Notes (Signed)
Rachel Carrillo had surgery on 09/03/19  She may return to work on 09/11/19 per Dr. Henreitta Leber

## 2019-09-03 NOTE — Interval H&P Note (Signed)
History and Physical Interval Note:  09/03/2019 9:08 AM  Rachel Carrillo  has presented today for surgery, with the diagnosis of Cholelithiasis.  The various methods of treatment have been discussed with the patient and family. After consideration of risks, benefits and other options for treatment, the patient has consented to  Procedure(s): LAPAROSCOPIC CHOLECYSTECTOMY (N/A) as a surgical intervention.  The patient's history has been reviewed, patient examined, no change in status, stable for surgery.  I have reviewed the patient's chart and labs.  Questions were answered to the patient's satisfaction.    No questions or changes. EGD was normal.  Lucretia Roers

## 2019-09-04 ENCOUNTER — Emergency Department (HOSPITAL_COMMUNITY)
Admission: EM | Admit: 2019-09-04 | Discharge: 2019-09-04 | Disposition: A | Discharge status: Home / Self Care | Payer: Commercial Managed Care - PPO | Source: Home / Self Care

## 2019-09-04 ENCOUNTER — Other Ambulatory Visit: Payer: Self-pay

## 2019-09-04 ENCOUNTER — Encounter (HOSPITAL_COMMUNITY): Payer: Self-pay | Admitting: Emergency Medicine

## 2019-09-04 LAB — CBC
HCT: 40 % (ref 36.0–46.0)
Hemoglobin: 13 g/dL (ref 12.0–15.0)
MCH: 28.7 pg (ref 26.0–34.0)
MCHC: 32.5 g/dL (ref 30.0–36.0)
MCV: 88.3 fL (ref 80.0–100.0)
Platelets: 325 10*3/uL (ref 150–400)
RBC: 4.53 MIL/uL (ref 3.87–5.11)
RDW: 12.1 % (ref 11.5–15.5)
WBC: 9.8 10*3/uL (ref 4.0–10.5)
nRBC: 0 % (ref 0.0–0.2)

## 2019-09-04 LAB — SURGICAL PATHOLOGY

## 2019-09-04 LAB — COMPREHENSIVE METABOLIC PANEL
ALT: 27 U/L (ref 0–44)
AST: 26 U/L (ref 15–41)
Albumin: 4.4 g/dL (ref 3.5–5.0)
Alkaline Phosphatase: 52 U/L (ref 38–126)
Anion gap: 9 (ref 5–15)
BUN: 9 mg/dL (ref 6–20)
CO2: 22 mmol/L (ref 22–32)
Calcium: 8.9 mg/dL (ref 8.9–10.3)
Chloride: 105 mmol/L (ref 98–111)
Creatinine, Ser: 0.49 mg/dL (ref 0.44–1.00)
GFR calc Af Amer: >60 mL/min (ref >60)
GFR calc non Af Amer: >60 mL/min (ref >60)
Glucose, Bld: 113 mg/dL — ABNORMAL HIGH (ref 70–99)
Potassium: 3.8 mmol/L (ref 3.5–5.1)
Sodium: 136 mmol/L (ref 135–145)
Total Bilirubin: 0.7 mg/dL (ref 0.3–1.2)
Total Protein: 7.3 g/dL (ref 6.5–8.1)

## 2019-09-04 LAB — LIPASE, BLOOD: Lipase: 22 U/L (ref 11–51)

## 2019-09-04 NOTE — ED Triage Notes (Signed)
Pt present with N/V and back pain. Pt had gallbladder removed this morning. Pt tried taking her prescribed medication with no relief.

## 2019-09-09 ENCOUNTER — Telehealth: Payer: Self-pay

## 2019-09-09 NOTE — Telephone Encounter (Signed)
Laparoscopic cholecystectomy 09/03/19 DR.Bridges- rash appeared last friday under breast and straight line going down the abdomen. Not taking amy medications. She has used benadryl cream for the itching- she was instructed to try oral benadryl  For the next few days and if no better to call office- denies fever, chills, no abdominal pain, no nausea or vomiting.

## 2019-09-18 ENCOUNTER — Telehealth (INDEPENDENT_AMBULATORY_CARE_PROVIDER_SITE_OTHER): Payer: Self-pay | Admitting: General Surgery

## 2019-09-18 DIAGNOSIS — K8020 Calculus of gallbladder without cholecystitis without obstruction: Secondary | ICD-10-CM

## 2019-09-18 NOTE — Progress Notes (Signed)
[facility]  I am calling the patient for post operative evaluation. This is not a billable encounter as it is under the global charges for the surgery.  The patient had a laparoscopic cholecystectomy on 09/03/2019.  She went went to the ED because she was dry heavy and vomiting and had lab work done that was normal. She ultimately left because waited so long without being seen.  The patient reports that now she is is doing well after that immediate post op time. The are tolerating a diet but is doing low fat, having good pain control, and having regular Bms but they are loose.  The incisions are healing without issues. The patient has no concerns.   Pathology: FINAL MICROSCOPIC DIAGNOSIS:   A. GALLBLADDER, CHOLECYSTECTOMY:  - Chronic cholecystitis and cholelithiasis   Will see the patient PRN.   Algis Greenhouse, MD [facility] [address] [place] [place], [place] [address] [phone] (office)

## 2020-02-11 ENCOUNTER — Encounter: Payer: Self-pay | Admitting: Emergency Medicine

## 2020-02-11 ENCOUNTER — Other Ambulatory Visit: Payer: Self-pay

## 2020-02-11 ENCOUNTER — Ambulatory Visit
Admission: EM | Admit: 2020-02-11 | Discharge: 2020-02-11 | Disposition: A | Discharge status: Home / Self Care | Payer: Commercial Managed Care - PPO | Attending: Emergency Medicine | Admitting: Emergency Medicine

## 2020-02-11 DIAGNOSIS — Z1152 Encounter for screening for COVID-19: Secondary | ICD-10-CM

## 2020-02-11 DIAGNOSIS — R42 Dizziness and giddiness: Secondary | ICD-10-CM | POA: Diagnosis not present

## 2020-02-11 DIAGNOSIS — J069 Acute upper respiratory infection, unspecified: Secondary | ICD-10-CM | POA: Diagnosis not present

## 2020-02-11 MED ORDER — CETIRIZINE HCL 10 MG PO TABS: 10.0000 mg | ORAL_TABLET | Freq: Every day | ORAL | 0 refills | Status: DC

## 2020-02-11 MED ORDER — DEXAMETHASONE 4 MG PO TABS: 4.0000 mg | ORAL_TABLET | Freq: Every day | ORAL | 0 refills | Status: AC

## 2020-02-11 MED ORDER — FLUTICASONE PROPIONATE 50 MCG/ACT NA SUSP
1.0000 | Freq: Every day | NASAL | 0 refills | Status: DC
Start: 2020-02-11 — End: 2022-01-03

## 2020-02-11 MED ORDER — BENZONATATE 100 MG PO CAPS
100.0000 mg | ORAL_CAPSULE | Freq: Three times a day (TID) | ORAL | 0 refills | Status: DC | PRN
Start: 2020-02-11 — End: 2022-01-03

## 2020-02-11 NOTE — ED Provider Notes (Signed)
[facility]   [id number] 02/11/20 Arrival Time: 0859   CC: COVID symptoms  SUBJECTIVE: History from: patient.  Rachel Carrillo is a 40 y.o. female who presents to the urgent care for complaint of fever, cough, congestion, dizziness, body aches for the past few days.  Has children in the house with the same symptom.  Reports she has a COVID-vaccine 1 day prior to developing her symptoms.  Denies recent travel.  Has tried OTC medication without relief.  Denies alleviating or aggravating factors.  Denies previous symptoms in the past.   Denies  chills, fatigue, sinus pain, rhinorrhea, sore throat, SOB, wheezing, chest pain, nausea, changes in bowel or bladder habits.    ROS: As per HPI.  All other pertinent ROS negative.     Past Medical History:  Diagnosis Date  . HSV-2 infection   . Migraine    Past Surgical History:  Procedure Laterality Date  . BIOPSY  08/29/2019   Procedure: BIOPSY;  Surgeon: [name], MD;  Location: [facility];  Service: Gastroenterology;;  gastic   . CHOLECYSTECTOMY N/A 09/03/2019   Procedure: LAPAROSCOPIC CHOLECYSTECTOMY;  Surgeon: Lucretia Roers, MD;  Location: [facility] ORS;  Service: General;  Laterality: N/A;  . ENDOMETRIAL ABLATION N/A 05/16/2017   Procedure: ENDOMETRIAL ABLATION WITH MINERVA;  Surgeon: [name], MD;  Location: [facility] ORS;  Service: Gynecology;  Laterality: N/A;  . ESOPHAGOGASTRODUODENOSCOPY (EGD) WITH PROPOFOL N/A 08/29/2019   Procedure: ESOPHAGOGASTRODUODENOSCOPY (EGD) WITH PROPOFOL;  Surgeon: [name], MD;  Location: [facility];  Service: Gastroenterology;  Laterality: N/A;  1245  . HYSTEROSCOPY WITH D & C N/A 05/16/2017   Procedure: DILATATION AND CURETTAGE /HYSTEROSCOPY;  Surgeon: [name], MD;  Location: [facility] OR[facility];  Service: Gynecology;  Laterality: N/A;  . TUBAL LIGATION  02/18/2011   Procedure: POST PARTUM TUBAL LIGATION;  Surgeon: [name], MD;  Location: [facility] ORS;  Service:  Gynecology;  Laterality: Bilateral;  Post partum tubal ligation bilateral with filshie clips.  . WISDOM TOOTH EXTRACTION     No Known Allergies No current facility-administered medications on file prior to encounter.   Current Outpatient Medications on File Prior to Encounter  Medication Sig Dispense Refill  . ondansetron (ZOFRAN) 4 MG tablet Take 1 tablet (4 mg total) by mouth every 8 (eight) hours as needed. 30 tablet 1  . oxyCODONE (ROXICODONE) 5 MG immediate release tablet Take 1 tablet (5 mg total) by mouth every 4 (four) hours as needed for severe pain or breakthrough pain. 10 tablet 0  . sucralfate (CARAFATE) 1 g tablet Take 1 tablet (1 g total) by mouth 4 (four) times daily -  with meals and at bedtime. 120 tablet 1  . [DISCONTINUED] omeprazole (PRILOSEC) 20 MG capsule Take 1 capsule (20 mg total) by mouth daily. 30 capsule 0   Social History   Socioeconomic History  . Marital status: Legally Separated    Spouse name: Not on file  . Number of children: Not on file  . Years of education: Not on file  . Highest education level: Not on file  Occupational History  . Not on file  Tobacco Use  . Smoking status: Former Smoker    Packs/day: 0.25    Years: 4.00    Pack years: 1.00    Types: Cigarettes    Quit date: 08/27/2011    Years since quitting: 8.4  . Smokeless tobacco: Never Used  Vaping Use  . Vaping Use: Never used  Substance  and Sexual Activity  . Alcohol use: No  . Drug use: No  . Sexual activity: Not Currently    Birth control/protection: None, Surgical    Comment: tubal  Other Topics Concern  . Not on file  Social History Narrative  . Not on file   Social Determinants of Health   Financial Resource Strain: Not on file  Food Insecurity: Not on file  Transportation Needs: Not on file  Physical Activity: Not on file  Stress: Not on file  Social Connections: Not on file  Intimate Partner Violence: Not on file   Family History  Problem Relation Age of  Onset  . Hypertension Mother   . Other Mother        TIA  . Stroke Mother   . Irritable bowel syndrome Mother   . Diabetes Maternal Aunt   . Arthritis Maternal Aunt        rheumatoid  . Arthritis Maternal Uncle        rheumatoid  . Arthritis Maternal Grandmother        rheumatoid  . Crohn's disease Maternal Grandfather   . Cancer Maternal Grandfather   . Heart disease Paternal Grandfather   . Other Paternal Grandfather        was on oxygen    OBJECTIVE:  Vitals:   02/11/20 0923 02/11/20 0925  BP:  106/72  Pulse:  83  Resp:  18  Temp:  98.1 F (36.7 C)  TempSrc:  Oral  SpO2:  94%  Weight: (!) 394 lb 10 oz (179 kg)   Height: 5\' 4"  (1.626 m)      General appearance: alert; appears fatigued, but nontoxic; speaking in full sentences and tolerating own secretions HEENT: NCAT; Ears: EACs clear, TMs pearly gray; Eyes: PERRL.  EOM grossly intact. Sinuses: nontender; Nose: nares patent without rhinorrhea, Throat: oropharynx clear, tonsils non erythematous or enlarged, uvula midline  Neck: supple without LAD Lungs: unlabored respirations, symmetrical air entry; cough: moderate; no respiratory distress; CTAB Heart: regular rate and rhythm.  Radial pulses 2+ symmetrical bilaterally Skin: warm and dry Psychological: alert and cooperative; normal mood and affect  LABS:  No results found for this or any previous visit (from the past 24 hour(s)).   ASSESSMENT & PLAN:  1. Encounter for screening for COVID-19   2. URI with cough and congestion   3. Dizziness     Meds ordered this encounter  Medications  . fluticasone (FLONASE) 50 MCG/ACT nasal spray    Sig: Place 1 spray into both nostrils daily for 14 days.    Dispense:  16 g    Refill:  0  . cetirizine (ZYRTEC ALLERGY) 10 MG tablet    Sig: Take 1 tablet (10 mg total) by mouth daily.    Dispense:  30 tablet    Refill:  0  . dexamethasone (DECADRON) 4 MG tablet    Sig: Take 1 tablet (4 mg total) by mouth daily for 7  days.    Dispense:  7 tablet    Refill:  0  . benzonatate (TESSALON) 100 MG capsule    Sig: Take 1 capsule (100 mg total) by mouth 3 (three) times daily as needed for cough.    Dispense:  30 capsule    Refill:  0    Discharge instructions  COVID testing ordered.  It will take between 2-7 days for test results.  Someone will contact you regarding abnormal results.    Get plenty of rest and push fluids Tessalon  Perles prescribed for cough Zyrtec for nasal congestion, runny nose, and/or sore throat  flonase for nasal congestion and runny nose Decadron was prescribed Use medications daily for symptom relief Use OTC medications like ibuprofen or tylenol as needed fever or pain Call or go to the ED if you have any new or worsening symptoms such as fever, worsening cough, shortness of breath, chest tightness, chest pain, turning blue, changes in mental status, etc...   Reviewed expectations re: course of current medical issues. Questions answered. Outlined signs and symptoms indicating need for more acute intervention. Patient verbalized understanding. After Visit Summary given.         Durward Parcel, FNP 02/11/20 1008

## 2020-02-11 NOTE — Discharge Instructions (Signed)
COVID testing ordered.  It will take between 2-7 days for test results.  Someone will contact you regarding abnormal results.   ° °Get plenty of rest and push fluids °Tessalon Perles prescribed for cough °Zyrtec for nasal congestion, runny nose, and/or sore throat °flonase for nasal congestion and runny nose °Decadron was prescribed °Use medications daily for symptom relief °Use OTC medications like ibuprofen or tylenol as needed fever or pain °Call or go to the ED if you have any new or worsening symptoms such as fever, worsening cough, shortness of breath, chest tightness, chest pain, turning blue, changes in mental status, etc...  °

## 2020-02-11 NOTE — ED Triage Notes (Signed)
Had first covid vaccine on Friday 1/14  Woke up sat dizzy, coughing, body aches.

## 2020-02-13 LAB — COVID-19, FLU A+B NAA
Influenza A, NAA: NOT DETECTED
Influenza B, NAA: NOT DETECTED
SARS-CoV-2, NAA: DETECTED — AB

## 2020-04-17 ENCOUNTER — Other Ambulatory Visit: Payer: Self-pay

## 2020-04-17 ENCOUNTER — Emergency Department (HOSPITAL_COMMUNITY): Payer: Commercial Managed Care - PPO

## 2020-04-17 ENCOUNTER — Emergency Department (HOSPITAL_COMMUNITY)
Admission: EM | Admit: 2020-04-17 | Discharge: 2020-04-17 | Disposition: A | Discharge status: Home / Self Care | Payer: Commercial Managed Care - PPO | Attending: Emergency Medicine | Admitting: Emergency Medicine

## 2020-04-17 ENCOUNTER — Encounter (HOSPITAL_COMMUNITY): Payer: Self-pay | Admitting: Emergency Medicine

## 2020-04-17 DIAGNOSIS — T59811A Toxic effect of smoke, accidental (unintentional), initial encounter: Secondary | ICD-10-CM | POA: Insufficient documentation

## 2020-04-17 DIAGNOSIS — R42 Dizziness and giddiness: Secondary | ICD-10-CM | POA: Insufficient documentation

## 2020-04-17 DIAGNOSIS — R112 Nausea with vomiting, unspecified: Secondary | ICD-10-CM | POA: Insufficient documentation

## 2020-04-17 DIAGNOSIS — Z87891 Personal history of nicotine dependence: Secondary | ICD-10-CM | POA: Diagnosis not present

## 2020-04-17 LAB — BLOOD GAS, ARTERIAL
Acid-base deficit: 0 mmol/L (ref 0.0–2.0)
Bicarbonate: 24.4 mmol/L (ref 20.0–28.0)
FIO2: 21
O2 Saturation: 94.4 %
Patient temperature: 37.1
pCO2 arterial: 38.8 mmHg (ref 32.0–48.0)
pH, Arterial: 7.409 (ref 7.350–7.450)
pO2, Arterial: 71.2 mmHg — ABNORMAL LOW (ref 83.0–108.0)

## 2020-04-17 LAB — CBC WITH DIFFERENTIAL/PLATELET
Abs Immature Granulocytes: 0.04 10*3/uL (ref 0.00–0.07)
Basophils Absolute: 0 10*3/uL (ref 0.0–0.1)
Basophils Relative: 0 %
Eosinophils Absolute: 0 10*3/uL (ref 0.0–0.5)
Eosinophils Relative: 0 %
HCT: 41.7 % (ref 36.0–46.0)
Hemoglobin: 13.9 g/dL (ref 12.0–15.0)
Immature Granulocytes: 0 %
Lymphocytes Relative: 9 %
Lymphs Abs: 1 10*3/uL (ref 0.7–4.0)
MCH: 29.7 pg (ref 26.0–34.0)
MCHC: 33.3 g/dL (ref 30.0–36.0)
MCV: 89.1 fL (ref 80.0–100.0)
Monocytes Absolute: 0.4 10*3/uL (ref 0.1–1.0)
Monocytes Relative: 4 %
Neutro Abs: 9.2 10*3/uL — ABNORMAL HIGH (ref 1.7–7.7)
Neutrophils Relative %: 87 %
Platelets: 271 10*3/uL (ref 150–400)
RBC: 4.68 MIL/uL (ref 3.87–5.11)
RDW: 12.5 % (ref 11.5–15.5)
WBC: 10.6 10*3/uL — ABNORMAL HIGH (ref 4.0–10.5)
nRBC: 0 % (ref 0.0–0.2)

## 2020-04-17 LAB — COMPREHENSIVE METABOLIC PANEL
ALT: 16 U/L (ref 0–44)
AST: 17 U/L (ref 15–41)
Albumin: 4.6 g/dL (ref 3.5–5.0)
Alkaline Phosphatase: 50 U/L (ref 38–126)
Anion gap: 9 (ref 5–15)
BUN: 14 mg/dL (ref 6–20)
CO2: 25 mmol/L (ref 22–32)
Calcium: 8.8 mg/dL — ABNORMAL LOW (ref 8.9–10.3)
Chloride: 104 mmol/L (ref 98–111)
Creatinine, Ser: 0.54 mg/dL (ref 0.44–1.00)
GFR, Estimated: >60 mL/min (ref >60)
Glucose, Bld: 109 mg/dL — ABNORMAL HIGH (ref 70–99)
Potassium: 4 mmol/L (ref 3.5–5.1)
Sodium: 138 mmol/L (ref 135–145)
Total Bilirubin: 1.1 mg/dL (ref 0.3–1.2)
Total Protein: 7.6 g/dL (ref 6.5–8.1)

## 2020-04-17 LAB — LIPASE, BLOOD: Lipase: 36 U/L (ref 11–51)

## 2020-04-17 LAB — CARBOXYHEMOGLOBIN - COOX: Carboxyhemoglobin: 1.9 % — ABNORMAL HIGH (ref 0.5–1.5)

## 2020-04-17 MED ORDER — ONDANSETRON HCL 4 MG/2ML IJ SOLN
4.0000 mg | Freq: Once | INTRAMUSCULAR | Status: AC
  Administered 2020-04-17: 4 mg via INTRAVENOUS
  Filled 2020-04-17: qty 2

## 2020-04-17 MED ORDER — SODIUM CHLORIDE 0.9 % IV BOLUS
1000.0000 mL | Freq: Once | INTRAVENOUS | Status: AC
  Administered 2020-04-17: 1000 mL via INTRAVENOUS

## 2020-04-17 MED ORDER — ONDANSETRON HCL 4 MG PO TABS: 4.0000 mg | ORAL_TABLET | Freq: Three times a day (TID) | ORAL | 0 refills | Status: DC | PRN

## 2020-04-17 MED ORDER — METOCLOPRAMIDE HCL 5 MG/ML IJ SOLN
10.0000 mg | Freq: Once | INTRAMUSCULAR | Status: AC
  Administered 2020-04-17: 10 mg via INTRAVENOUS
  Filled 2020-04-17: qty 2

## 2020-04-17 MED ORDER — DIPHENHYDRAMINE HCL 50 MG/ML IJ SOLN
25.0000 mg | Freq: Once | INTRAMUSCULAR | Status: AC
  Administered 2020-04-17: 25 mg via INTRAVENOUS
  Filled 2020-04-17: qty 1

## 2020-04-17 NOTE — ED Notes (Signed)
Pt eating, drinking and sitting up.
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
## 2020-04-17 NOTE — Discharge Instructions (Signed)
Like we discussed, I prescribed you medication called Zofran.  You can take this up to 3 times a day for your nausea and vomiting.  Only take this as prescribed.  Only take this for severe nausea and vomiting that you cannot control.  Please continue to monitor your symptoms as well as your daughter symptoms.  If they worsen, please return to the emergency department for reevaluation.  It was a pleasure to meet you both.

## 2020-11-17 ENCOUNTER — Ambulatory Visit (HOSPITAL_COMMUNITY)
Admission: RE | Admit: 2020-11-17 | Discharge: 2020-11-17 | Disposition: A | Discharge status: Home / Self Care | Payer: Commercial Managed Care - PPO | Source: Ambulatory Visit | Attending: Family Medicine | Admitting: Family Medicine

## 2020-11-17 ENCOUNTER — Other Ambulatory Visit (HOSPITAL_COMMUNITY): Payer: Self-pay | Admitting: Family Medicine

## 2020-11-17 ENCOUNTER — Other Ambulatory Visit: Payer: Self-pay

## 2020-11-17 DIAGNOSIS — R109 Unspecified abdominal pain: Secondary | ICD-10-CM | POA: Diagnosis not present

## 2021-01-25 ENCOUNTER — Other Ambulatory Visit (HOSPITAL_COMMUNITY): Payer: Self-pay | Admitting: Internal Medicine

## 2021-01-25 DIAGNOSIS — Z1231 Encounter for screening mammogram for malignant neoplasm of breast: Secondary | ICD-10-CM

## 2021-02-03 ENCOUNTER — Ambulatory Visit (HOSPITAL_COMMUNITY)
Admission: RE | Admit: 2021-02-03 | Discharge: 2021-02-03 | Disposition: A | Discharge status: Home / Self Care | Payer: Commercial Managed Care - PPO | Source: Ambulatory Visit | Attending: Internal Medicine | Admitting: Internal Medicine

## 2021-02-03 ENCOUNTER — Other Ambulatory Visit: Payer: Self-pay

## 2021-02-03 DIAGNOSIS — Z1231 Encounter for screening mammogram for malignant neoplasm of breast: Secondary | ICD-10-CM | POA: Diagnosis present

## 2021-08-23 ENCOUNTER — Ambulatory Visit: Payer: Commercial Managed Care - PPO

## 2021-12-03 ENCOUNTER — Encounter (INDEPENDENT_AMBULATORY_CARE_PROVIDER_SITE_OTHER): Payer: Self-pay | Admitting: Gastroenterology

## 2022-01-02 ENCOUNTER — Encounter: Payer: Commercial Managed Care - PPO | Admitting: Radiology

## 2022-01-03 ENCOUNTER — Ambulatory Visit (INDEPENDENT_AMBULATORY_CARE_PROVIDER_SITE_OTHER): Payer: Commercial Managed Care - PPO | Admitting: Radiology

## 2022-01-03 ENCOUNTER — Encounter: Payer: Self-pay | Admitting: Radiology

## 2022-01-03 DIAGNOSIS — N926 Irregular menstruation, unspecified: Secondary | ICD-10-CM | POA: Diagnosis not present

## 2022-01-03 DIAGNOSIS — R5383 Other fatigue: Secondary | ICD-10-CM

## 2022-01-03 DIAGNOSIS — G43839 Menstrual migraine, intractable, without status migrainosus: Secondary | ICD-10-CM

## 2022-01-03 NOTE — Progress Notes (Signed)
   Rachel Carrillo 03/04/1980 [id number]   History:  41 y.o. G6P3 here as a new patient. She reports worsening PMS with migraine on the right side of her head the week before cycle starts. Nausea and vomiting the same week. Symptoms x 10 months. Worsening fatigue and depression/anxiety during this time as well. Has trouble getting out of bed some days. Ablation 2019 for menorrhagia, still has a monthly cycle, just lighter than it was previously.   Gynecologic History Patient's last menstrual period was 12/29/2021 (exact date). Period Cycle (Days): 28 Period Duration (Days): 4 Period Pattern: Regular Menstrual Flow: Heavy (heavy first day, then lighter) Menstrual Control: Maxi pad Dysmenorrhea: (!) Severe (varies from cycle to cycle, severe to moderate) Dysmenorrhea Symptoms: Cramping Contraception/Family planning: tubal ligation Sexually active: yes   Obstetric History OB History  Gravida Para Term Preterm AB Living  _0 SAB IAB Ectopic Multiple Live Births  3       3    # Outcome Date GA Lbr Len/2nd Weight Sex Delivery Anes PTL Lv  6 Term 02/17/11 [id number] / 00:29 9 lb 13.3 oz (4.459 kg) F Vag-Spont EPI  LIV     Birth Comments: wnl  5 SAB           4 SAB           3 SAB           2 Term           1 Term              The following portions of the patient's history were reviewed and updated as appropriate: allergies, current medications, past family history, past medical history, past social history, past surgical history, and problem list.  Review of Systems Pertinent items noted in HPI and remainder of comprehensive ROS otherwise negative.   Past medical history, past surgical history, family history and social history were all reviewed and documented in the EPIC chart.   Exam:  There were no vitals filed for this visit. There is no height or weight on file to calculate BMI.  Physical Exam Constitutional:      Appearance: Normal appearance. She is  normal weight.  Neurological:     General: No focal deficit present.     Mental Status: She is alert. Mental status is at baseline.  Psychiatric:        Mood and Affect: Mood normal.        Thought Content: Thought content normal.        Judgment: Judgment normal.      Assessment/Plan:   1. Intractable menstrual migraine without status migrainosus - Comp Met (CMET) - CBC  2. Irregular menses - FSH - LH - Testos,Total,Free and SHBG (Female) - Progesterone - DHEA-sulfate  3. Fatigue, unspecified type - B12 and Folate Panel - Thyroid Panel With TSH - Vitamin D (25 hydroxy)    Will contact with labs completed today and manage accordingly.  CRubbie BattiestB WHNP-BC 8:20 AM 01/03/2022

## 2022-01-07 LAB — TESTOS,TOTAL,FREE AND SHBG (FEMALE)
Free Testosterone: 2.9 pg/mL (ref 0.1–6.4)
Sex Hormone Binding: 96.9 nmol/L (ref 17–124)
Testosterone, Total, LC-MS-MS: 45 ng/dL (ref 2–45)

## 2022-01-07 LAB — COMPREHENSIVE METABOLIC PANEL
AG Ratio: 1.7 (calc) (ref 1.0–2.5)
ALT: 8 U/L (ref 6–29)
AST: 11 U/L (ref 10–30)
Albumin: 4.5 g/dL (ref 3.6–5.1)
Alkaline phosphatase (APISO): 54 U/L (ref 31–125)
BUN: 10 mg/dL (ref 7–25)
CO2: 22 mmol/L (ref 20–32)
Calcium: 9.2 mg/dL (ref 8.6–10.2)
Chloride: 105 mmol/L (ref 98–110)
Creat: 0.63 mg/dL (ref 0.50–0.99)
Globulin: 2.7 g/dL (calc) (ref 1.9–3.7)
Glucose, Bld: 90 mg/dL (ref 65–99)
Potassium: 4 mmol/L (ref 3.5–5.3)
Sodium: 138 mmol/L (ref 135–146)
Total Bilirubin: 0.3 mg/dL (ref 0.2–1.2)
Total Protein: 7.2 g/dL (ref 6.1–8.1)

## 2022-01-07 LAB — B12 AND FOLATE PANEL
Folate: 12.1 ng/mL
Vitamin B-12: 407 pg/mL (ref 200–1100)

## 2022-01-07 LAB — LUTEINIZING HORMONE: LH: 6.8 m[IU]/mL

## 2022-01-07 LAB — CBC
HCT: 39.4 % (ref 35.0–45.0)
Hemoglobin: 13.3 g/dL (ref 11.7–15.5)
MCH: 29.8 pg (ref 27.0–33.0)
MCHC: 33.8 g/dL (ref 32.0–36.0)
MCV: 88.1 fL (ref 80.0–100.0)
MPV: 11.3 fL (ref 7.5–12.5)
Platelets: 292 10*3/uL (ref 140–400)
RBC: 4.47 10*6/uL (ref 3.80–5.10)
RDW: 11.8 % (ref 11.0–15.0)
WBC: 5.5 10*3/uL (ref 3.8–10.8)

## 2022-01-07 LAB — THYROID PANEL WITH TSH
Free Thyroxine Index: 2.4 (ref 1.4–3.8)
T3 Uptake: 28 % (ref 22–35)
T4, Total: 8.7 ug/dL (ref 5.1–11.9)
TSH: 2.26 mIU/L

## 2022-01-07 LAB — VITAMIN D 25 HYDROXY (VIT D DEFICIENCY, FRACTURES): Vit D, 25-Hydroxy: 16 ng/mL — ABNORMAL LOW (ref 30–100)

## 2022-01-07 LAB — FOLLICLE STIMULATING HORMONE: FSH: 5.5 m[IU]/mL

## 2022-01-07 LAB — DHEA-SULFATE: DHEA-SO4: 137 ug/dL (ref 15–205)

## 2022-01-07 LAB — PROGESTERONE: Progesterone: 0.5 ng/mL

## 2022-01-13 ENCOUNTER — Other Ambulatory Visit: Payer: Self-pay

## 2022-01-13 DIAGNOSIS — E559 Vitamin D deficiency, unspecified: Secondary | ICD-10-CM

## 2022-01-13 DIAGNOSIS — R7989 Other specified abnormal findings of blood chemistry: Secondary | ICD-10-CM

## 2022-01-13 MED ORDER — VITAMIN D (ERGOCALCIFEROL) 1.25 MG (50000 UNIT) PO CAPS: 50000.0000 [IU] | ORAL_CAPSULE | ORAL | 0 refills | Status: DC

## 2022-01-13 MED ORDER — SLYND 4 MG PO TABS: 4.0000 mg | ORAL_TABLET | Freq: Every day | ORAL | 0 refills | Status: DC

## 2022-01-17 ENCOUNTER — Other Ambulatory Visit (HOSPITAL_COMMUNITY): Payer: Self-pay | Admitting: Internal Medicine

## 2022-01-17 DIAGNOSIS — Z1231 Encounter for screening mammogram for malignant neoplasm of breast: Secondary | ICD-10-CM

## 2022-02-06 ENCOUNTER — Ambulatory Visit (HOSPITAL_COMMUNITY)
Admission: RE | Admit: 2022-02-06 | Discharge: 2022-02-06 | Disposition: A | Discharge status: Home / Self Care | Payer: Commercial Managed Care - PPO | Source: Ambulatory Visit | Attending: Internal Medicine | Admitting: Internal Medicine

## 2022-02-06 DIAGNOSIS — Z1231 Encounter for screening mammogram for malignant neoplasm of breast: Secondary | ICD-10-CM | POA: Insufficient documentation

## 2022-04-04 ENCOUNTER — Other Ambulatory Visit: Payer: Self-pay | Admitting: Radiology

## 2022-04-04 DIAGNOSIS — R7989 Other specified abnormal findings of blood chemistry: Secondary | ICD-10-CM

## 2022-04-05 NOTE — Telephone Encounter (Signed)
Medication refill request: slynd  Last OV: 01/03/22  Next AEX: 04/19/22 Last MMG (if hormonal medication request): 02/07/22 Refill authorized: please advise

## 2022-04-19 ENCOUNTER — Encounter: Payer: Self-pay | Admitting: Radiology

## 2022-04-19 ENCOUNTER — Ambulatory Visit (INDEPENDENT_AMBULATORY_CARE_PROVIDER_SITE_OTHER): Payer: Commercial Managed Care - PPO | Admitting: Radiology

## 2022-04-19 VITALS — BP 110/78

## 2022-04-19 DIAGNOSIS — E559 Vitamin D deficiency, unspecified: Secondary | ICD-10-CM | POA: Diagnosis not present

## 2022-04-19 DIAGNOSIS — R7989 Other specified abnormal findings of blood chemistry: Secondary | ICD-10-CM | POA: Diagnosis not present

## 2022-04-19 DIAGNOSIS — N920 Excessive and frequent menstruation with regular cycle: Secondary | ICD-10-CM | POA: Diagnosis not present

## 2022-04-19 MED ORDER — SLYND 4 MG PO TABS: ORAL_TABLET | ORAL | 1 refills | Status: DC

## 2022-04-19 NOTE — Progress Notes (Signed)
   FRANCESE SHANK Sep 01, 1980 [id number]   History:  42 y.o. here for follow up after taking slynd for 3 months to manage symptoms of low progesterone. She was also treated with 12 weeks of vit D therapy for deficiency. Headaches and nausea have resolved, flow was lighter but did last a bit longer.  Originally presented on 01/03/22 with worsening PMS with migraine on the right side of her head the week before cycle starts. Nausea and vomiting the same week. Symptoms x 10 months. Worsening fatigue and depression/anxiety during this time as well. Has trouble getting out of bed some days. Ablation 2019 for menorrhagia, still has a monthly cycle, just lighter than it was previously.   Gynecologic History Patient's last menstrual period was 04/04/2022 (exact date).   Contraception/Family planning: tubal ligation Sexually active: yes   Obstetric History OB History  Gravida Para Term Preterm AB Living  6 3 3   3 3   SAB IAB Ectopic Multiple Live Births  3       3    # Outcome Date GA Lbr Len/2nd Weight Sex Delivery Anes PTL Lv  6 Term 02/17/11 [redacted]w[redacted]d 15:10 / 00:29 9 lb 13.3 oz (4.459 kg) F Vag-Spont EPI  LIV     Birth Comments: wnl  5 SAB           4 SAB           3 SAB           2 Term           1 Term              The following portions of the patient's history were reviewed and updated as appropriate: allergies, current medications, past family history, past medical history, past social history, past surgical history, and problem list.  Review of Systems Pertinent items noted in HPI and remainder of comprehensive ROS otherwise negative.   Past medical history, past surgical history, family history and social history were all reviewed and documented in the EPIC chart.   Exam:  Vitals:   04/19/22 0745  BP: 110/78   There is no height or weight on file to calculate BMI.  Physical Exam Constitutional:      Appearance: Normal appearance. She is normal weight.  Neurological:      General: No focal deficit present.     Mental Status: She is alert. Mental status is at baseline.  Psychiatric:        Mood and Affect: Mood normal.        Thought Content: Thought content normal.        Judgment: Judgment normal.      Assessment/Plan:   1. Vitamin D deficiency - Vitamin D (25 hydroxy)  2. Low serum progesterone - Drospirenone (SLYND) 4 MG TABS; TAKE FOUR (4) MG BY MOUTH DAILY.  Dispense: 84 tablet; Refill: 1  3. Menorrhagia with regular cycle - Drospirenone (SLYND) 4 MG TABS; TAKE FOUR (4) MG BY MOUTH DAILY.  Dispense: 84 tablet; Refill: 1   Scheduled AEX in 3-4 months, due for pap  Jamylah Marinaccio B WHNP-BC 8:07 AM 04/19/2022

## 2022-04-20 LAB — VITAMIN D 25 HYDROXY (VIT D DEFICIENCY, FRACTURES): Vit D, 25-Hydroxy: 70 ng/mL (ref 30–100)

## 2022-09-13 ENCOUNTER — Ambulatory Visit: Payer: Commercial Managed Care - PPO | Admitting: Radiology

## 2022-09-13 ENCOUNTER — Encounter: Payer: Self-pay | Admitting: Radiology

## 2022-09-13 ENCOUNTER — Ambulatory Visit (INDEPENDENT_AMBULATORY_CARE_PROVIDER_SITE_OTHER): Payer: Commercial Managed Care - PPO | Admitting: Radiology

## 2022-09-13 VITALS — BP 110/78

## 2022-09-13 DIAGNOSIS — N921 Excessive and frequent menstruation with irregular cycle: Secondary | ICD-10-CM | POA: Diagnosis not present

## 2022-09-13 DIAGNOSIS — B9689 Other specified bacterial agents as the cause of diseases classified elsewhere: Secondary | ICD-10-CM

## 2022-09-13 DIAGNOSIS — N760 Acute vaginitis: Secondary | ICD-10-CM | POA: Diagnosis not present

## 2022-09-13 LAB — WET PREP FOR TRICH, YEAST, CLUE

## 2022-09-13 MED ORDER — METRONIDAZOLE 0.75 % VA GEL: 1.0000 | Freq: Every day | VAGINAL | 0 refills | Status: AC

## 2022-09-13 NOTE — Progress Notes (Signed)
      Subjective: Rachel Carrillo is a 42 y.o. female who complains of BTB on OCPs and vaginal discharge x 1 months. Will notice spotting when wiping every few days. No missed or late pills. Slynd has resolved premenstrual headaches she was having.  Review of Systems  All other systems reviewed and are negative.   Past Medical History:  Diagnosis Date   Anxiety    HSV-2 infection    IBS (irritable bowel syndrome)    Migraine       Objective:  Today's Vitals   09/13/22 0820  BP: 110/78   There is no height or weight on file to calculate BMI.   -General: no acute distress -Vulva: without lesions or discharge -Vagina: discharge present, wet prep obtained -Cervix: no lesion or discharge, no CMT -Perineum: no lesions -Uterus: Mobile, non tender -Adnexa: no masses or tenderness   Microscopic wet-mount exam shows clue cells.   Raynelle Fanning, CMA present for exam  Assessment:/Plan:   1. Breakthrough bleeding on birth control pills Reassured spotting may be from BV Continue slynd daily  2. BV (bacterial vaginosis) - WET PREP FOR TRICH, YEAST, CLUE - metroNIDAZOLE (METROGEL) 0.75 % vaginal gel; Place 1 Applicatorful vaginally at bedtime for 5 days.  Dispense: 70 g; Refill: 0    Avoid intercourse until symptoms are resolved. Safe sex encouraged. Avoid the use of soaps or perfumed products in the peri area. Avoid tub baths and sitting in sweaty or wet clothing for prolonged periods of time.

## 2022-10-02 ENCOUNTER — Other Ambulatory Visit: Payer: Self-pay | Admitting: Radiology

## 2022-10-02 DIAGNOSIS — R7989 Other specified abnormal findings of blood chemistry: Secondary | ICD-10-CM

## 2022-10-02 DIAGNOSIS — N920 Excessive and frequent menstruation with regular cycle: Secondary | ICD-10-CM

## 2022-10-02 NOTE — Telephone Encounter (Signed)
Med refill request: Slynd Last OV: 09/13/22 Next AEX: 11/28/22 Last MMG (if hormonal med) 02/06/22 Refill authorized: Please Advise, #[phone], 1 RF

## 2022-11-21 ENCOUNTER — Encounter: Payer: Self-pay | Admitting: Radiology

## 2022-11-28 ENCOUNTER — Ambulatory Visit (INDEPENDENT_AMBULATORY_CARE_PROVIDER_SITE_OTHER): Payer: Commercial Managed Care - PPO | Admitting: Radiology

## 2022-11-28 ENCOUNTER — Encounter: Payer: Self-pay | Admitting: Radiology

## 2022-11-28 ENCOUNTER — Other Ambulatory Visit (HOSPITAL_COMMUNITY)
Admission: RE | Admit: 2022-11-28 | Discharge: 2022-11-28 | Disposition: A | Discharge status: Home / Self Care | Payer: Commercial Managed Care - PPO | Source: Ambulatory Visit | Attending: Radiology | Admitting: Radiology

## 2022-11-28 VITALS — BP 122/82 | Ht 64.5 in | Wt 141.0 lb

## 2022-11-28 DIAGNOSIS — N920 Excessive and frequent menstruation with regular cycle: Secondary | ICD-10-CM

## 2022-11-28 DIAGNOSIS — Z01419 Encounter for gynecological examination (general) (routine) without abnormal findings: Secondary | ICD-10-CM

## 2022-11-28 DIAGNOSIS — R7989 Other specified abnormal findings of blood chemistry: Secondary | ICD-10-CM | POA: Diagnosis not present

## 2022-11-28 MED ORDER — SLYND 4 MG PO TABS: ORAL_TABLET | ORAL | 4 refills | Status: DC

## 2022-11-28 NOTE — Progress Notes (Signed)
   Rachel Carrillo Mar 30, 1980 [id number]   History:  42 y.o. G6P3 presents for annual exam. Had BTB with Slynd 2 months ago, has regulated since starting a new pack. No other gyn concerns. Has a PCP.   Gynecologic History Patient's last menstrual period was 11/11/2022 (exact date). Period Cycle (Days): 28 Period Duration (Days): 7 Period Pattern: Regular Menstrual Flow: Light, Heavy Menstrual Control: Maxi pad, Thin pad Dysmenorrhea: None Contraception/Family planning: oral progesterone-only contraceptive and tubal ligation Sexually active: yes Last Pap: 2018. Results were: normal Last mammogram: 1/24. Results were: normal  Obstetric History OB History  Gravida Para Term Preterm AB Living  6 3 3   3 3   SAB IAB Ectopic Multiple Live Births  3       3    # Outcome Date GA Lbr Len/2nd Weight Sex Type Anes PTL Lv  6 Term 02/17/11 [redacted]w[redacted]d 15:10 / 00:29 9 lb 13.3 oz (4.459 kg) F Vag-Spont EPI  LIV     Birth Comments: wnl  5 SAB           4 SAB           3 SAB           2 Term           1 Term             The following portions of the patient's history were reviewed and updated as appropriate: allergies, current medications, past family history, past medical history, past social history, past surgical history, and problem list.  ROS  Past medical history, past surgical history, family history and social history were all reviewed and documented in the EPIC chart.  Exam:  Vitals:   11/28/22 0743  BP: 122/82  Weight: 141 lb (64 kg)  Height: 5' 4.5" (1.638 m)   Body mass index is 23.83 kg/m.  Physical Exam   Raynelle Fanning, CMA present for exam  Assessment/Plan:   1. Well woman exam with routine gynecological exam - Cytology - PAP( [place]) - Mammogram yearly - Labs with PCP  2. Low serum progesterone - Drospirenone (SLYND) 4 MG TABS; TAKE ONE (1) TABLET BY MOUTH EVERY DAY  Dispense: 84 tablet; Refill: 4  3. Menorrhagia with regular cycle - Drospirenone (SLYND) 4  MG TABS; TAKE ONE (1) TABLET BY MOUTH EVERY DAY  Dispense: 84 tablet; Refill: 4    Notify us for any more irregular bleeding.  Discussed SBE, colonoscopy and DEXA screening as directed/appropriate. Recommend of exercise weekly, including weight bearing exercise. Encouraged the use of seatbelts and sunscreen.  Return in about 1 year (around 11/28/2023) for Annual.  Arlie Solomons B WHNP-BC 8:05 AM 11/28/2022

## 2022-11-28 NOTE — Patient Instructions (Signed)
Preventive Care 40-42 Years Old, Female Preventive care refers to lifestyle choices and visits with your health care provider that can promote health and wellness. Preventive care visits are also called wellness exams. What can I expect for my preventive care visit? Counseling Your health care provider may ask you questions about your: Medical history, including: Past medical problems. Family medical history. Pregnancy history. Current health, including: Menstrual cycle. Method of birth control. Emotional well-being. Home life and relationship well-being. Sexual activity and sexual health. Lifestyle, including: Alcohol, nicotine or tobacco, and drug use. Access to firearms. Diet, exercise, and sleep habits. Work and work environment. Sunscreen use. Safety issues such as seatbelt and bike helmet use. Physical exam Your health care provider will check your: Height and weight. These may be used to calculate your BMI (body mass index). BMI is a measurement that tells if you are at a healthy weight. Waist circumference. This measures the distance around your waistline. This measurement also tells if you are at a healthy weight and may help predict your risk of certain diseases, such as type 2 diabetes and high blood pressure. Heart rate and blood pressure. Body temperature. Skin for abnormal spots. What immunizations do I need?  Vaccines are usually given at various ages, according to a schedule. Your health care provider will recommend vaccines for you based on your age, medical history, and lifestyle or other factors, such as travel or where you work. What tests do I need? Screening Your health care provider may recommend screening tests for certain conditions. This may include: Lipid and cholesterol levels. Diabetes screening. This is done by checking your blood sugar (glucose) after you have not eaten for a while (fasting). Pelvic exam and Pap test. Hepatitis B test. Hepatitis C  test. HIV (human immunodeficiency virus) test. STI (sexually transmitted infection) testing, if you are at risk. Lung cancer screening. Colorectal cancer screening. Mammogram. Talk with your health care provider about when you should start having regular mammograms. This may depend on whether you have a family history of breast cancer. BRCA-related cancer screening. This may be done if you have a family history of breast, ovarian, tubal, or peritoneal cancers. Bone density scan. This is done to screen for osteoporosis. Talk with your health care provider about your test results, treatment options, and if necessary, the need for more tests. Follow these instructions at home: Eating and drinking  Eat a diet that includes fresh fruits and vegetables, whole grains, lean protein, and low-fat dairy products. Take vitamin and mineral supplements as recommended by your health care provider. Do not drink alcohol if: Your health care provider tells you not to drink. You are pregnant, may be pregnant, or are planning to become pregnant. If you drink alcohol: Limit how much you have to 0-1 drink a day. Know how much alcohol is in your drink. In the [place] one drink equals one 12 oz bottle of beer (355 mL), one 5 oz glass of wine (148 mL), or one 1 oz glass of hard liquor (44 mL). Lifestyle Brush your teeth every morning and night with fluoride toothpaste. Floss one time each day. Exercise for at least 30 minutes 5 or more days each week. Do not use any products that contain nicotine or tobacco. These products include cigarettes, chewing tobacco, and vaping devices, such as e-cigarettes. If you need help quitting, ask your health care provider. Do not use drugs. If you are sexually active, practice safe sex. Use a condom or other form of protection to   prevent STIs. If you do not wish to become pregnant, use a form of birth control. If you plan to become pregnant, see your health care provider for a  prepregnancy visit. Take aspirin only as told by your health care provider. Make sure that you understand how much to take and what form to take. Work with your health care provider to find out whether it is safe and beneficial for you to take aspirin daily. Find healthy ways to manage stress, such as: Meditation, yoga, or listening to music. Journaling. Talking to a trusted person. Spending time with friends and family. Minimize exposure to UV radiation to reduce your risk of skin cancer. Safety Always wear your seat belt while driving or riding in a vehicle. Do not drive: If you have been drinking alcohol. Do not ride with someone who has been drinking. When you are tired or distracted. While texting. If you have been using any mind-altering substances or drugs. Wear a helmet and other protective equipment during sports activities. If you have firearms in your house, make sure you follow all gun safety procedures. Seek help if you have been physically or sexually abused. What's next? Visit your health care provider once a year for an annual wellness visit. Ask your health care provider how often you should have your eyes and teeth checked. Stay up to date on all vaccines. This information is not intended to replace advice given to you by your health care provider. Make sure you discuss any questions you have with your health care provider. Document Revised: 07/07/2020 Document Reviewed: 07/07/2020 Elsevier Patient Education  2024 [employer]  

## 2022-12-04 LAB — CYTOLOGY - PAP
Comment: NEGATIVE
Diagnosis: UNDETERMINED — AB
High risk HPV: NEGATIVE

## 2023-01-22 ENCOUNTER — Other Ambulatory Visit (HOSPITAL_COMMUNITY): Payer: Self-pay | Admitting: Internal Medicine

## 2023-01-22 DIAGNOSIS — Z1231 Encounter for screening mammogram for malignant neoplasm of breast: Secondary | ICD-10-CM

## 2023-02-12 ENCOUNTER — Ambulatory Visit (HOSPITAL_COMMUNITY)
Admission: RE | Admit: 2023-02-12 | Discharge: 2023-02-12 | Disposition: A | Discharge status: Home / Self Care | Payer: Commercial Managed Care - PPO | Source: Ambulatory Visit | Attending: Internal Medicine | Admitting: Internal Medicine

## 2023-02-12 DIAGNOSIS — Z1231 Encounter for screening mammogram for malignant neoplasm of breast: Secondary | ICD-10-CM | POA: Diagnosis present

## 2023-02-14 ENCOUNTER — Other Ambulatory Visit (HOSPITAL_COMMUNITY): Payer: Self-pay | Admitting: Family Medicine

## 2023-02-14 DIAGNOSIS — R928 Other abnormal and inconclusive findings on diagnostic imaging of breast: Secondary | ICD-10-CM

## 2023-02-27 ENCOUNTER — Encounter: Payer: Self-pay | Admitting: Nurse Practitioner

## 2023-02-27 ENCOUNTER — Ambulatory Visit (INDEPENDENT_AMBULATORY_CARE_PROVIDER_SITE_OTHER): Payer: Commercial Managed Care - PPO | Admitting: Nurse Practitioner

## 2023-02-27 ENCOUNTER — Other Ambulatory Visit: Payer: Commercial Managed Care - PPO

## 2023-02-27 VITALS — BP 104/68 | HR 96 | Wt 135.0 lb

## 2023-02-27 DIAGNOSIS — R1031 Right lower quadrant pain: Secondary | ICD-10-CM

## 2023-02-27 DIAGNOSIS — N760 Acute vaginitis: Secondary | ICD-10-CM

## 2023-02-27 DIAGNOSIS — N926 Irregular menstruation, unspecified: Secondary | ICD-10-CM | POA: Diagnosis not present

## 2023-02-27 LAB — WET PREP FOR TRICH, YEAST, CLUE

## 2023-02-27 MED ORDER — METRONIDAZOLE 500 MG PO TABS: 500.0000 mg | ORAL_TABLET | Freq: Two times a day (BID) | ORAL | 0 refills | Status: DC

## 2023-02-27 NOTE — Telephone Encounter (Signed)
 Yes - needs OV

## 2023-02-27 NOTE — Progress Notes (Signed)
   Acute Office Visit  Subjective:    Patient ID: Rachel Carrillo, female    DOB: February 09, 1980, 43 y.o.   MRN: [id number]   HPI 43 y.o. [id number] presents today for RLQ pain x 3 months. Pain is intermittent, occurs a few times per week, sharp, lasts ~ 30 minutes, nothing makes it worse or better. She has to stop what she's doing and rest when it occurs. Not cyclic or related to bleeding. Stopped Slynd  in September. Headaches and vomiting restarted in October, so she restarted Slynd  at that time. Has had  irregular bleeding about every 14 days since November 2024. Bleeding is mostly light. Endometrial ablation 2019. Not sexually active. Denies vaginal symptoms. Treated for BV in August when she was also having some irregular bleeding.   Patient's last menstrual period was 02/16/2023 (approximate).    Review of Systems  Constitutional: Negative.   Genitourinary:  Positive for menstrual problem. Negative for vaginal discharge and vaginal pain.       Objective:    Physical Exam Constitutional:      Appearance: Normal appearance.  Genitourinary:    General: Normal vulva.     Vagina: Vaginal discharge present. No erythema.     Cervix: Normal.     BP 104/68 (BP Location: Right Arm, Patient Position: Sitting, Cuff Size: Normal)   Pulse 96   Wt 135 lb (61.2 kg)   LMP 02/16/2023 (Approximate)   SpO2 97%   BMI 22.81 kg/m  Wt Readings from Last 3 Encounters:  02/27/23 135 lb (61.2 kg)  11/28/22 141 lb (64 kg)  04/17/20 185 lb (83.9 kg)        Patient informed chaperone available to be present for breast and/or pelvic exam. Patient has requested no chaperone to be present. Patient has been advised what will be completed during breast and pelvic exam.   Wet prep + clue cells (+ odor)  Assessment & Plan:   Problem List Items Addressed This Visit   None Visit Diagnoses       Bacterial vaginosis    -  Primary   Relevant Medications   metroNIDAZOLE  (FLAGYL ) 500 MG tablet      Right lower quadrant pain       Relevant Orders   US  PELVIS TRANSVAGINAL NON-OB (TV ONLY)     Irregular bleeding       Relevant Orders   WET PREP FOR TRICH, YEAST, CLUE      Plan: Wet prep positive for clue cells - Flagyl  500 mg BID x 7 days. Discussed common causes for BV and preventative methods. Start Probiotic. Will schedule ultrasound for evaluation of pain. If irregular bleeding continues after BV treated may need to consider alternative birth control.   Return if symptoms worsen or fail to improve.    Rachel Carrillo, 2:30 PM 02/27/2023

## 2023-03-13 ENCOUNTER — Encounter: Payer: Self-pay | Admitting: Nurse Practitioner

## 2023-03-13 ENCOUNTER — Ambulatory Visit (INDEPENDENT_AMBULATORY_CARE_PROVIDER_SITE_OTHER): Payer: Commercial Managed Care - PPO

## 2023-03-13 ENCOUNTER — Ambulatory Visit (INDEPENDENT_AMBULATORY_CARE_PROVIDER_SITE_OTHER): Payer: Commercial Managed Care - PPO | Admitting: Nurse Practitioner

## 2023-03-13 ENCOUNTER — Other Ambulatory Visit: Payer: Commercial Managed Care - PPO | Admitting: Nurse Practitioner

## 2023-03-13 VITALS — BP 108/82 | HR 87

## 2023-03-13 DIAGNOSIS — N920 Excessive and frequent menstruation with regular cycle: Secondary | ICD-10-CM

## 2023-03-13 DIAGNOSIS — R1031 Right lower quadrant pain: Secondary | ICD-10-CM | POA: Diagnosis not present

## 2023-03-13 MED ORDER — NORETHINDRONE 0.35 MG PO TABS: 1.0000 | ORAL_TABLET | Freq: Every day | ORAL | 0 refills | Status: DC

## 2023-03-13 NOTE — Progress Notes (Signed)
   Acute Office Visit  Subjective:    Patient ID: Rachel Carrillo, female    DOB: 02-08-1980, 43 y.o.   MRN: [id number]   HPI 43 y.o. presents today for ultrasound. Seen 02/27/23 with complaints of RLQ pain x 3 months. Pain is intermittent, occurs a few times per week, sharp, lasts ~ 30 minutes, nothing makes it worse or better. She has to stop what she's doing and rest when it occurs. Not cyclic or related to bleeding. Restarted [facility] October 2024. Taking for PMS symptoms - nausea, headaches. Good management. Now has bleeding about every 2 weeks since November. 2019 endometrial ablation. Not sexually active. Treated for BV 02/27/23. 08/2022 TSH 1.460. Vapes.   Patient's last menstrual period was 03/13/2023 (approximate).    Review of Systems  Constitutional: Negative.   Genitourinary:  Positive for menstrual problem.       Objective:    Physical Exam Constitutional:      Appearance: Normal appearance.   GU: Not indicated  BP 108/82   Pulse 87   LMP 03/13/2023 (Approximate)   SpO2 98%  Wt Readings from Last 3 Encounters:  02/27/23 135 lb (61.2 kg)  11/28/22 141 lb (64 kg)  04/17/20 185 lb (83.9 kg)        Assessment & Plan:   Problem List Items Addressed This Visit   None Visit Diagnoses       Right lower quadrant pain    -  Primary     Unusually frequent menses       Relevant Medications   norethindrone (ORTHO MICRONOR) 0.35 MG tablet      Vaginal ultrasound: Anteverted uterus, normal size and shape, no myometrial masses.  Symmetrical endometrium - 6.68 mm.  No masses or thickening seen.  S/P ablation appearance.  An 8.7 x 5.7 mm simple avascular fluid collection is noted LUS.  Both ovaries normal size with normal follicle pattern and normal perfusion.  No adnexal masses, no free fluid.  Plan: Reviewed normal ultrasound findings. Discussed progestin-only options for bleeding management - try alternative POP, IUD, Depo as well as surgical options. Will avoid estrogen  due to vaping status. Would like to try mini pill.  Return in about 3 months (around 06/10/2023) for Med follow up.    Olivia Mackie DNP, 10:20 AM 03/13/2023

## 2023-04-03 ENCOUNTER — Ambulatory Visit (HOSPITAL_COMMUNITY)
Admission: RE | Admit: 2023-04-03 | Discharge: 2023-04-03 | Disposition: A | Discharge status: Home / Self Care | Payer: Commercial Managed Care - PPO | Source: Ambulatory Visit | Attending: Family Medicine | Admitting: Family Medicine

## 2023-04-03 ENCOUNTER — Encounter (HOSPITAL_COMMUNITY): Payer: Self-pay

## 2023-04-03 DIAGNOSIS — R928 Other abnormal and inconclusive findings on diagnostic imaging of breast: Secondary | ICD-10-CM | POA: Insufficient documentation

## 2023-04-24 IMAGING — MG MM DIGITAL SCREENING BILAT W/ TOMO AND CAD
8 series · 8 of 24 positions shown · non-contrast
Comparison: None.

CLINICAL DATA: Screening.

EXAM:
DIGITAL SCREENING BILATERAL MAMMOGRAM WITH TOMOSYNTHESIS AND CAD
TECHNIQUE: Bilateral screening digital craniocaudal and mediolateral oblique
mammograms were obtained. Bilateral screening digital breast
tomosynthesis was performed. The images were evaluated with
computer-aided detection.

[L CC synth-2D]
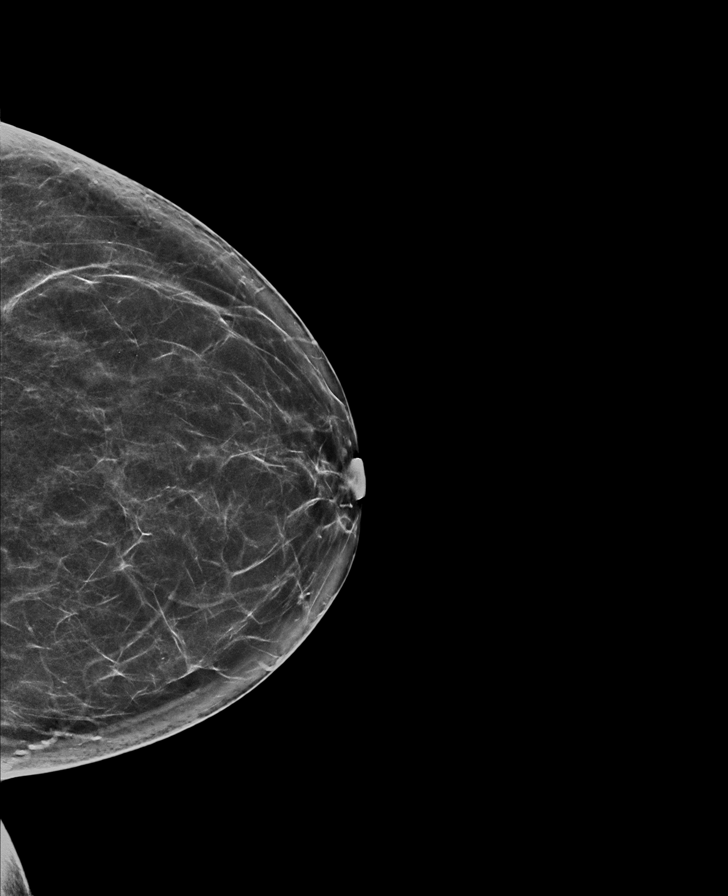

[R MLO synth-2D]
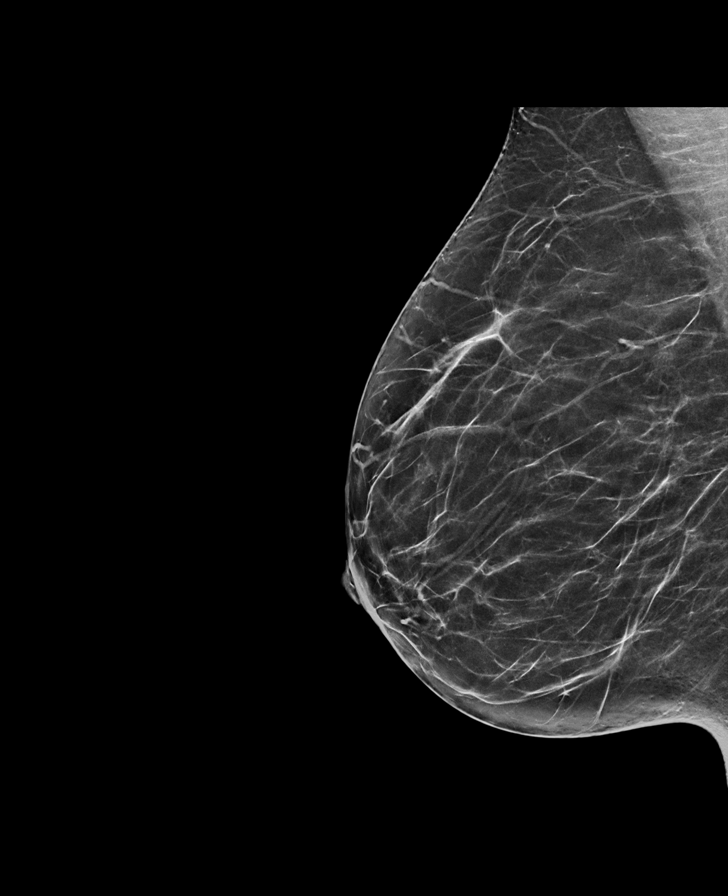

[R CC synth-2D]
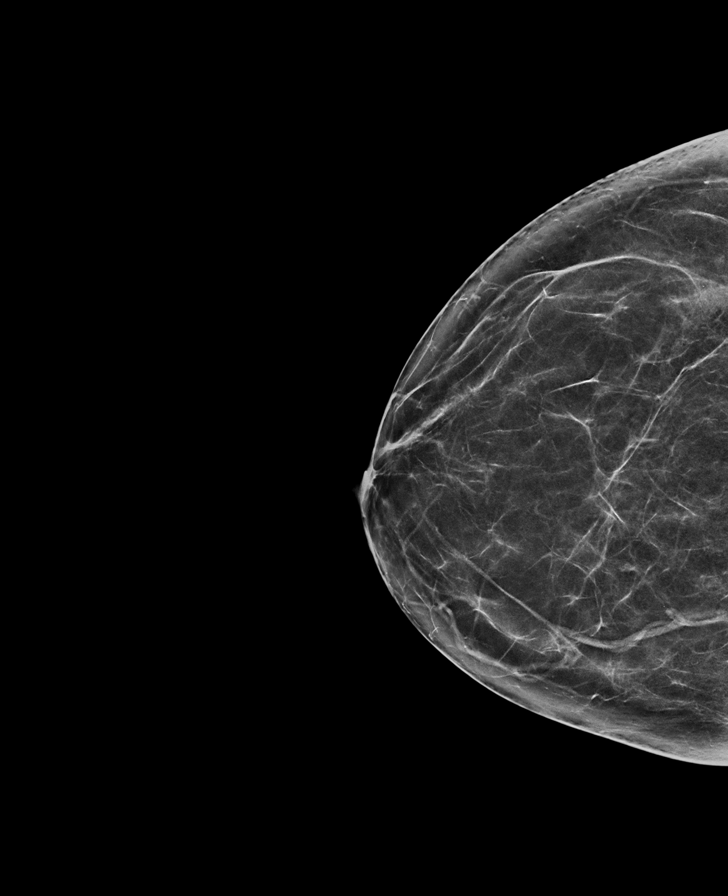

[L MLO synth-2D]
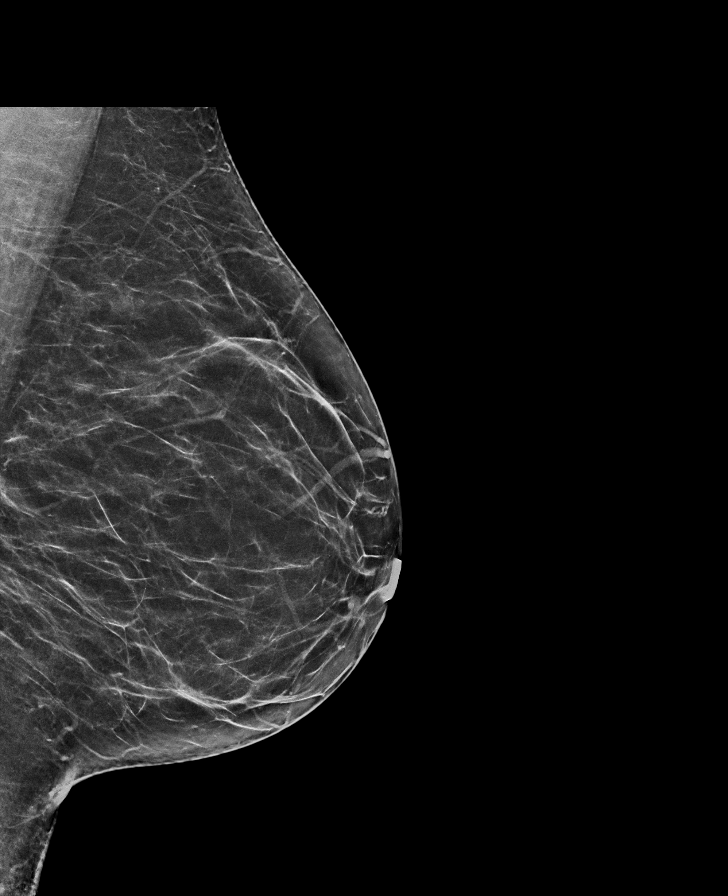

[L CC tomo · tomo slice 36/71.0]
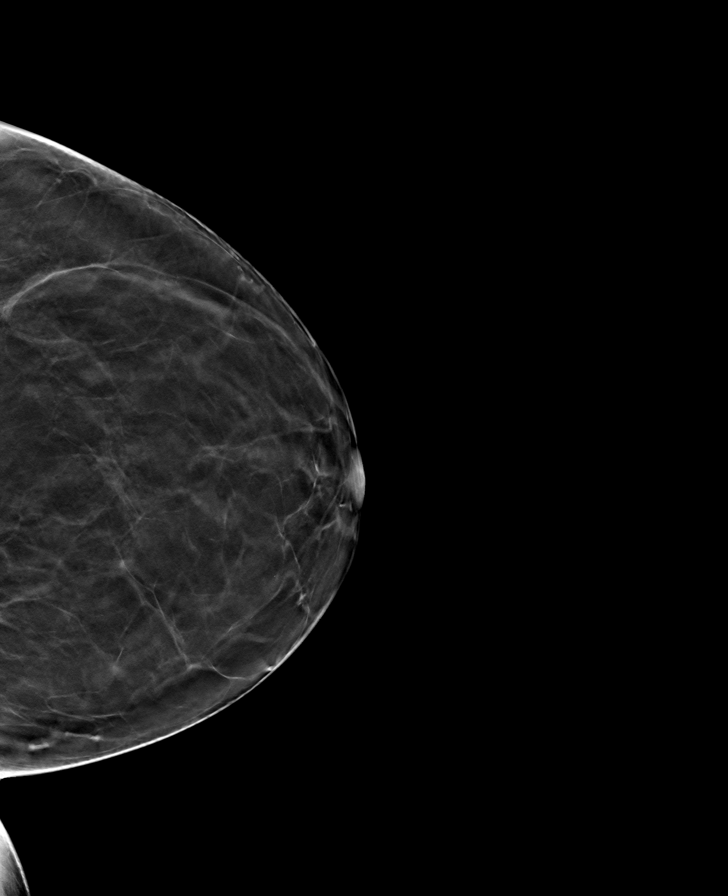

[R MLO tomo · tomo slice 35/68.0]
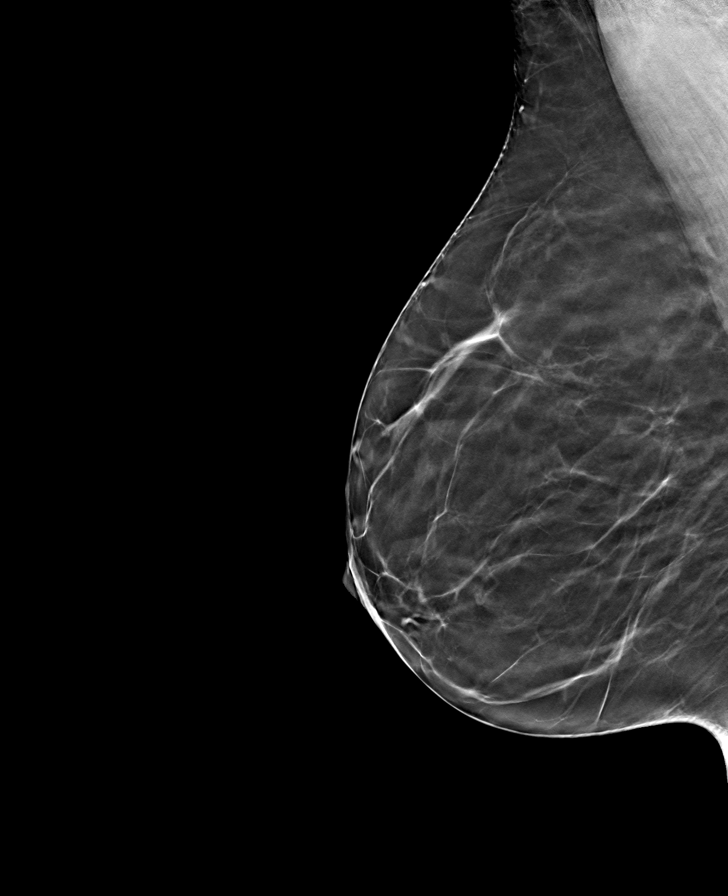

[L MLO tomo · tomo slice 34/67.0]
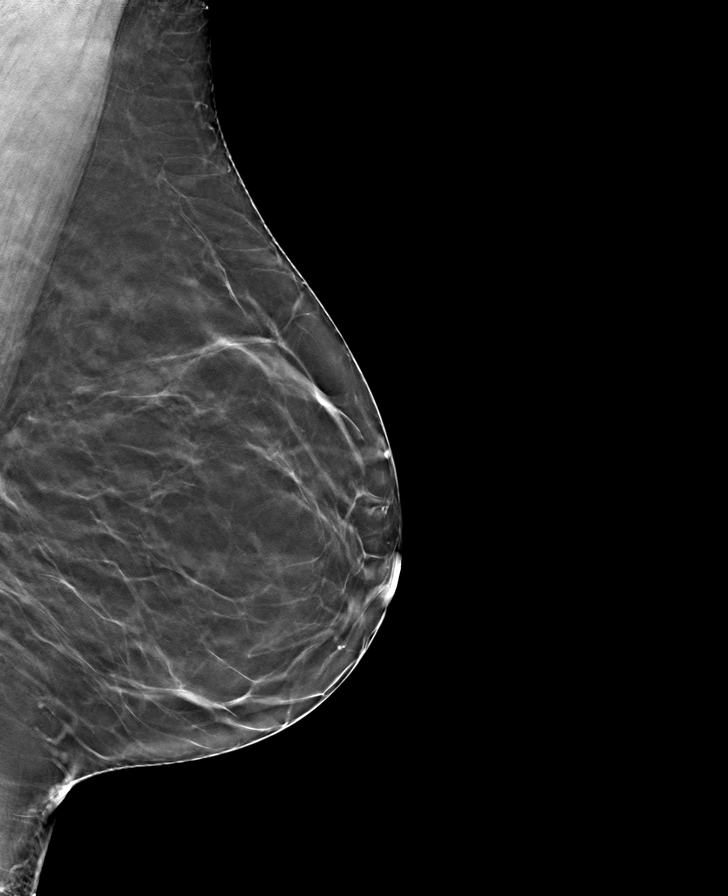

[R CC tomo · tomo slice 35/68.0]
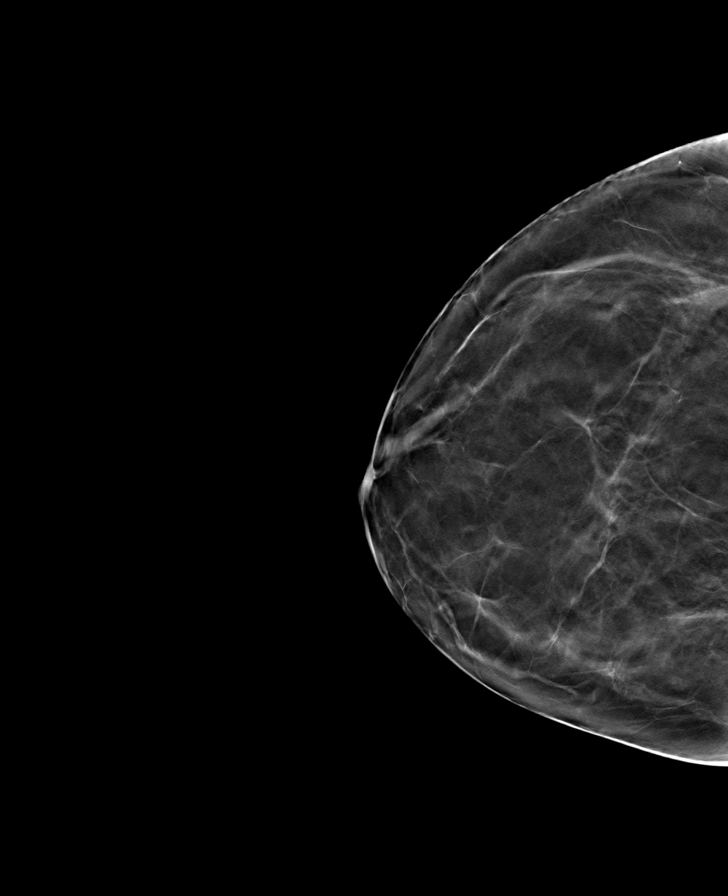

[8 of 24 positions shown; findings below may reference images not displayed]

ACR Breast Density Category b: There are scattered areas of
fibroglandular density.
FINDINGS: There are no findings suspicious for malignancy.
IMPRESSION: No mammographic evidence of malignancy. A result letter of this
screening mammogram will be mailed directly to the patient.

RECOMMENDATION:
Screening mammogram in one year. (Code:XG-X-X7B)

BI-RADS CATEGORY  1: Negative.

## 2023-05-17 ENCOUNTER — Other Ambulatory Visit: Payer: Self-pay | Admitting: Nurse Practitioner

## 2023-05-17 DIAGNOSIS — N920 Excessive and frequent menstruation with regular cycle: Secondary | ICD-10-CM

## 2023-05-17 NOTE — Telephone Encounter (Signed)
 Med refill request: norethindrone  0.35mg  Last AEX: 11/28/22 Next OV: 06/11/23 Last MMG (if hormonal med) 04/03/23 BI-RADS 2 benign Refill authorized: norethindrone  0.35mg  Please approve or deny as appropriate.

## 2023-05-28 ENCOUNTER — Other Ambulatory Visit: Payer: Self-pay | Admitting: Nurse Practitioner

## 2023-05-28 DIAGNOSIS — N920 Excessive and frequent menstruation with regular cycle: Secondary | ICD-10-CM

## 2023-05-28 NOTE — Telephone Encounter (Signed)
 Medication refill request: Micronor   Last AEX:  03/13/23  Next AEX: 06/11/23  Last MMG (if hormonal medication request): n/a Refill authorized: please advise

## 2023-05-29 MED ORDER — NORETHINDRONE 0.35 MG PO TABS: 1.0000 | ORAL_TABLET | Freq: Every day | ORAL | 0 refills | Status: DC

## 2023-06-11 ENCOUNTER — Encounter: Payer: Self-pay | Admitting: Nurse Practitioner

## 2023-06-11 ENCOUNTER — Ambulatory Visit (INDEPENDENT_AMBULATORY_CARE_PROVIDER_SITE_OTHER): Payer: Commercial Managed Care - PPO | Admitting: Nurse Practitioner

## 2023-06-11 DIAGNOSIS — N920 Excessive and frequent menstruation with regular cycle: Secondary | ICD-10-CM | POA: Diagnosis not present

## 2023-06-11 MED ORDER — NORETHINDRONE 0.35 MG PO TABS: 1.0000 | ORAL_TABLET | Freq: Every day | ORAL | 1 refills | Status: DC

## 2023-06-11 NOTE — Progress Notes (Signed)
   Acute Office Visit  Subjective:    Patient ID: Rachel Carrillo, female    DOB: 07/28/1980, 43 y.o.   MRN: [id number]   HPI 43 y.o. presents today for follow up. Started mini in February for irregular periods. Was on Slynd  prior but was having periods every 2 weeks. 2019 ablation. Normal TSH, normal ultrasound. Was also having RLQ pain. This has improved some as well.   Patient's last menstrual period was 06/04/2023 (exact date).    Review of Systems  Constitutional: Negative.   Genitourinary: Negative.        Objective:     Physical Exam Constitutional:      Appearance: Normal appearance.   GU: Not indicated  BP 112/70   Pulse 71   LMP 06/04/2023 (Exact Date)   SpO2 98%  Wt Readings from Last 3 Encounters:  02/27/23 135 lb (61.2 kg)  11/28/22 141 lb (64 kg)  04/17/20 185 lb (83.9 kg)       Assessment & Plan:   Problem List Items Addressed This Visit   None Visit Diagnoses       Unusually frequent menses       Relevant Medications   norethindrone  (MICRONOR ) 0.35 MG tablet      Plan: Doing well on Micronor . Will continue.   Return if symptoms worsen or fail to improve.    Andee Bamberger DNP, 9:55 AM 06/11/2023

## 2023-11-07 ENCOUNTER — Encounter (INDEPENDENT_AMBULATORY_CARE_PROVIDER_SITE_OTHER): Payer: Self-pay | Admitting: Gastroenterology

## 2023-12-04 ENCOUNTER — Ambulatory Visit (INDEPENDENT_AMBULATORY_CARE_PROVIDER_SITE_OTHER): Admitting: Radiology

## 2023-12-04 ENCOUNTER — Encounter: Payer: Self-pay | Admitting: Radiology

## 2023-12-04 VITALS — BP 114/72 | HR 96 | Ht 64.0 in | Wt 141.0 lb

## 2023-12-04 DIAGNOSIS — Z3009 Encounter for other general counseling and advice on contraception: Secondary | ICD-10-CM

## 2023-12-04 DIAGNOSIS — Z01419 Encounter for gynecological examination (general) (routine) without abnormal findings: Secondary | ICD-10-CM | POA: Diagnosis not present

## 2023-12-04 DIAGNOSIS — Z1331 Encounter for screening for depression: Secondary | ICD-10-CM

## 2023-12-04 DIAGNOSIS — N926 Irregular menstruation, unspecified: Secondary | ICD-10-CM

## 2023-12-04 MED ORDER — NORETHINDRONE 0.35 MG PO TABS
1.0000 | ORAL_TABLET | Freq: Every day | ORAL | 4 refills | Status: AC
Start: 2023-12-04 — End: ?

## 2023-12-04 NOTE — Patient Instructions (Signed)
 Preventive Care 58-43 Years Old, Female  Preventive care refers to lifestyle choices and visits with your health care provider that can promote health and wellness. Preventive care visits are also called wellness exams.  What can I expect for my preventive care visit?  Counseling  Your health care provider may ask you questions about your:  Medical history, including:  Past medical problems.  Family medical history.  Pregnancy history.  Current health, including:  Menstrual cycle.  Method of birth control.  Emotional well-being.  Home life and relationship well-being.  Sexual activity and sexual health.  Lifestyle, including:  Alcohol, nicotine or tobacco, and drug use.  Access to firearms.  Diet, exercise, and sleep habits.  Work and work Astronomer.  Sunscreen use.  Safety issues such as seatbelt and bike helmet use.  Physical exam  Your health care provider will check your:  Height and weight. These may be used to calculate your BMI (body mass index). BMI is a measurement that tells if you are at a healthy weight.  Waist circumference. This measures the distance around your waistline. This measurement also tells if you are at a healthy weight and may help predict your risk of certain diseases, such as type 2 diabetes and high blood pressure.  Heart rate and blood pressure.  Body temperature.  Skin for abnormal spots.  What immunizations do I need?    Vaccines are usually given at various ages, according to a schedule. Your health care provider will recommend vaccines for you based on your age, medical history, and lifestyle or other factors, such as travel or where you work.  What tests do I need?  Screening  Your health care provider may recommend screening tests for certain conditions. This may include:  Lipid and cholesterol levels.  Diabetes screening. This is done by checking your blood sugar (glucose) after you have not eaten for a while (fasting).  Pelvic exam and Pap test.  Hepatitis B test.  Hepatitis C  test.  HIV (human immunodeficiency virus) test.  STI (sexually transmitted infection) testing, if you are at risk.  Lung cancer screening.  Colorectal cancer screening.  Mammogram. Talk with your health care provider about when you should start having regular mammograms. This may depend on whether you have a family history of breast cancer.  BRCA-related cancer screening. This may be done if you have a family history of breast, ovarian, tubal, or peritoneal cancers.  Bone density scan. This is done to screen for osteoporosis.  Talk with your health care provider about your test results, treatment options, and if necessary, the need for more tests.  Follow these instructions at home:  Eating and drinking    Eat a diet that includes fresh fruits and vegetables, whole grains, lean protein, and low-fat dairy products.  Take vitamin and mineral supplements as recommended by your health care provider.  Do not drink alcohol if:  Your health care provider tells you not to drink.  You are pregnant, may be pregnant, or are planning to become pregnant.  If you drink alcohol:  Limit how much you have to 0-1 drink a day.  Know how much alcohol is in your drink. In the [place] one drink equals one 12 oz bottle of beer (355 mL), one 5 oz glass of wine (148 mL), or one 1 oz glass of hard liquor (44 mL).  Lifestyle  Brush your teeth every morning and night with fluoride toothpaste. Floss one time each day.  Exercise for at least  30 minutes 5 or more days each week.  Do not use any products that contain nicotine or tobacco. These products include cigarettes, chewing tobacco, and vaping devices, such as e-cigarettes. If you need help quitting, ask your health care provider.  Do not use drugs.  If you are sexually active, practice safe sex. Use a condom or other form of protection to prevent STIs.  If you do not wish to become pregnant, use a form of birth control. If you plan to become pregnant, see your health care provider for a  prepregnancy visit.  Take aspirin only as told by your health care provider. Make sure that you understand how much to take and what form to take. Work with your health care provider to find out whether it is safe and beneficial for you to take aspirin daily.  Find healthy ways to manage stress, such as:  Meditation, yoga, or listening to music.  Journaling.  Talking to a trusted person.  Spending time with friends and family.  Minimize exposure to UV radiation to reduce your risk of skin cancer.  Safety  Always wear your seat belt while driving or riding in a vehicle.  Do not drive:  If you have been drinking alcohol. Do not ride with someone who has been drinking.  When you are tired or distracted.  While texting.  If you have been using any mind-altering substances or drugs.  Wear a helmet and other protective equipment during sports activities.  If you have firearms in your house, make sure you follow all gun safety procedures.  Seek help if you have been physically or sexually abused.  What's next?  Visit your health care provider once a year for an annual wellness visit.  Ask your health care provider how often you should have your eyes and teeth checked.  Stay up to date on all vaccines.  This information is not intended to replace advice given to you by your health care provider. Make sure you discuss any questions you have with your health care provider.  Document Revised: 07/07/2020 Document Reviewed: 07/07/2020  Elsevier Patient Education  2024 [employer].

## 2023-12-04 NOTE — Progress Notes (Signed)
 Rachel Carrillo December 12, 1980 [id number]   History:  44 y.o. G6P3 presents for annual exam.Currently on Micronor , was switched with Annabella Shutter after she had prolonged bleeding with Slynd . She is having cramping and headaches now and has a period every 21 days that lasts 7 days and can be heavy at times. Frustrated by the bleeding and associated symptoms. Hx of migraines.  Gynecologic History Patient's last menstrual period was 11/21/2023 (approximate). Period Cycle (Days): 21 Period Duration (Days): 7 Period Pattern: Regular Menstrual Flow: Light, Heavy Menstrual Control: Thin pad, Maxi pad Dysmenorrhea: (!) Severe (mild to severe) Dysmenorrhea Symptoms: Cramping, Headache, Other (Comment) (vomiting with LMP) Contraception/Family planning: oral progesterone -only contraceptive and tubal ligation Sexually active: yes Last Pap: 2024. Results were: ASCUS HPV negative Last mammogram: 04/03/23. Results were: normal after follow up  Obstetric History OB History  Gravida Para Term Preterm AB Living  6 3 3  3 3   SAB IAB Ectopic Multiple Live Births  3    3    # Outcome Date GA Lbr Len/2nd Weight Sex Type Anes PTL Lv  6 Term 02/17/11 [redacted]w[redacted]d 15:10 / 00:29 9 lb 13.3 oz (4.459 kg) F Vag-Spont EPI  LIV     Birth Comments: wnl  5 SAB           4 SAB           3 SAB           2 Term           1 Term                12/04/2023    7:55 AM 11/01/2016    3:20 PM  Depression screen PHQ 2/9  Decreased Interest 1 0  Down, Depressed, Hopeless 1 0  PHQ - 2 Score 2 0  Altered sleeping 1   Tired, decreased energy 1   Change in appetite 3   Feeling bad or failure about yourself  1   Trouble concentrating 1   Moving slowly or fidgety/restless 1   Suicidal thoughts 0   PHQ-9 Score 10   Difficult doing work/chores Somewhat difficult      The following portions of the patient's history were reviewed and updated as appropriate: allergies, current medications, past family history, past medical  history, past social history, past surgical history, and problem list.  Review of Systems  All other systems reviewed and are negative.   Past medical history, past surgical history, family history and social history were all reviewed and documented in the EPIC chart.  Exam:  Vitals:   12/04/23 0753  BP: 114/72  Pulse: 96  SpO2: 97%  Weight: 141 lb (64 kg)  Height: 5' 4 (1.626 m)   Body mass index is 24.2 kg/m.  Physical Exam Vitals and nursing note reviewed. Exam conducted with a chaperone present.  Constitutional:      Appearance: Normal appearance. She is normal weight.  HENT:     Head: Normocephalic and atraumatic.  Neck:     Thyroid : No thyroid  mass, thyromegaly or thyroid  tenderness.  Cardiovascular:     Rate and Rhythm: Regular rhythm.     Heart sounds: Normal heart sounds.  Pulmonary:     Effort: Pulmonary effort is normal.     Breath sounds: Normal breath sounds.  Chest:  Breasts:    Breasts are symmetrical.     Right: Normal. No inverted nipple, mass, nipple discharge, skin change or tenderness.     Left: Normal.  No inverted nipple, mass, nipple discharge, skin change or tenderness.  Abdominal:     General: Abdomen is flat. Bowel sounds are normal.     Palpations: Abdomen is soft.  Genitourinary:    General: Normal vulva.     Vagina: Normal. No vaginal discharge, bleeding or lesions.     Cervix: Normal. No discharge or lesion.     Uterus: Normal. Not enlarged and not tender.      Adnexa: Right adnexa normal and left adnexa normal.       Right: No mass, tenderness or fullness.         Left: No mass, tenderness or fullness.    Lymphadenopathy:     Upper Body:     Right upper body: No axillary adenopathy.     Left upper body: No axillary adenopathy.  Skin:    General: Skin is warm and dry.  Neurological:     Mental Status: She is alert and oriented to person, place, and time.  Psychiatric:        Mood and Affect: Mood normal.        Thought  Content: Thought content normal.        Judgment: Judgment normal.      Darice Hoit, CMA present for exam  Assessment/Plan:   1. Well woman exam with routine gynecological exam (Primary) Pap 2027  2. Irregular menses Continue POPs for now, will consider Mirena - norethindrone  (MICRONOR ) 0.35 MG tablet; Take 1 tablet (0.35 mg total) by mouth daily.  Dispense: 84 tablet; Refill: 4  3. Counseling for birth control regarding intrauterine device (IUD) Will call back if she decides she wants to proceed with IUD insertion.  4. Depression screen    Return in about 1 year (around 12/03/2024) for Annual.  GINETTE COZIER B WHNP-BC 8:26 AM 12/04/2023

## 2024-02-08 ENCOUNTER — Other Ambulatory Visit (HOSPITAL_COMMUNITY): Payer: Self-pay | Admitting: Internal Medicine

## 2024-02-08 DIAGNOSIS — Z1231 Encounter for screening mammogram for malignant neoplasm of breast: Secondary | ICD-10-CM

## 2024-04-07 ENCOUNTER — Ambulatory Visit (HOSPITAL_COMMUNITY)

## 2024-12-04 ENCOUNTER — Ambulatory Visit: Admitting: Radiology
